# Patient Record
Sex: Male | Born: 1965 | Race: Black or African American | Hispanic: No | Marital: Married | Smoking: Never smoker
Health system: Southern US, Community
[De-identification: ages and names within clinical notes are randomized; demographics above are authoritative.]

---

## 2013-01-25 ENCOUNTER — Ambulatory Visit: Payer: Self-pay | Admitting: Nurse Practitioner

## 2021-05-02 ENCOUNTER — Encounter: Payer: Self-pay | Admitting: Pulmonary Disease

## 2021-06-06 ENCOUNTER — Institutional Professional Consult (permissible substitution): Payer: No Typology Code available for payment source | Admitting: Emergency Medicine

## 2021-06-06 ENCOUNTER — Other Ambulatory Visit: Payer: Self-pay

## 2021-06-06 ENCOUNTER — Encounter: Payer: Self-pay | Admitting: Emergency Medicine

## 2021-06-06 ENCOUNTER — Ambulatory Visit (INDEPENDENT_AMBULATORY_CARE_PROVIDER_SITE_OTHER): Payer: No Typology Code available for payment source | Admitting: Emergency Medicine

## 2021-06-06 VITALS — BP 124/68 | HR 103 | Temp 98.3°F | Ht 78.0 in | Wt 273.8 lb

## 2021-06-06 DIAGNOSIS — J84116 Cryptogenic organizing pneumonia: Secondary | ICD-10-CM | POA: Diagnosis not present

## 2021-06-06 DIAGNOSIS — Z79899 Other long term (current) drug therapy: Secondary | ICD-10-CM

## 2021-06-06 LAB — CBC WITH DIFFERENTIAL/PLATELET
Basophils Absolute: 0 10*3/uL (ref 0.0–0.1)
Basophils Relative: 0.3 % (ref 0.0–3.0)
Eosinophils Absolute: 0 10*3/uL (ref 0.0–0.7)
Eosinophils Relative: 0.3 % (ref 0.0–5.0)
HCT: 46.1 % (ref 39.0–52.0)
Hemoglobin: 15.8 g/dL (ref 13.0–17.0)
Lymphocytes Relative: 11.5 % — ABNORMAL LOW (ref 12.0–46.0)
Lymphs Abs: 0.8 10*3/uL (ref 0.7–4.0)
MCHC: 34.4 g/dL (ref 30.0–36.0)
MCV: 96.6 fl (ref 78.0–100.0)
Monocytes Absolute: 0.4 10*3/uL (ref 0.1–1.0)
Monocytes Relative: 5.3 % (ref 3.0–12.0)
Neutro Abs: 5.6 10*3/uL (ref 1.4–7.7)
Neutrophils Relative %: 82.6 % — ABNORMAL HIGH (ref 43.0–77.0)
Platelets: 262 10*3/uL (ref 150.0–400.0)
RBC: 4.77 Mil/uL (ref 4.22–5.81)
RDW: 12.2 % (ref 11.5–15.5)
WBC: 6.8 10*3/uL (ref 4.0–10.5)

## 2021-06-06 LAB — COMPREHENSIVE METABOLIC PANEL
ALT: 29 U/L (ref 0–53)
AST: 29 U/L (ref 0–37)
Albumin: 4.6 g/dL (ref 3.5–5.2)
Alkaline Phosphatase: 63 U/L (ref 39–117)
BUN: 18 mg/dL (ref 6–23)
CO2: 25 mEq/L (ref 19–32)
Calcium: 9.8 mg/dL (ref 8.4–10.5)
Chloride: 101 mEq/L (ref 96–112)
Creatinine, Ser: 0.92 mg/dL (ref 0.40–1.50)
GFR: 94.84 mL/min (ref >60.00)
Glucose, Bld: 190 mg/dL — ABNORMAL HIGH (ref 70–99)
Potassium: 4.1 mEq/L (ref 3.5–5.1)
Sodium: 137 mEq/L (ref 135–145)
Total Bilirubin: 0.7 mg/dL (ref 0.2–1.2)
Total Protein: 7.6 g/dL (ref 6.0–8.3)

## 2021-06-06 MED ORDER — AZATHIOPRINE 50 MG PO TABS: 150.0000 mg | ORAL_TABLET | Freq: Every morning | ORAL | 1 refills | Status: DC

## 2021-06-06 NOTE — Addendum Note (Signed)
Addended by: Demetrio Lapping E on: 06/06/2021 02:58 PM   Modules accepted: Orders

## 2021-06-06 NOTE — Addendum Note (Signed)
Addended by: Dorisann Frames R on: 06/06/2021 04:53 PM   Modules accepted: Orders

## 2021-06-06 NOTE — Patient Instructions (Signed)
We will check lab work today. We will plan to increase your Imuran to 150 mg daily.  We will call you with your lab results and confirm that you should start the new dose. Continue your prednisone 20 mg once daily. Continue your Bactrim 3 times a week. At your next visit we will discuss the timing of a repeat CT scan of the chest Follow with Dr Delton Coombes in 1 month

## 2021-06-06 NOTE — Assessment & Plan Note (Signed)
With steroid responsive infiltrates followed at the [place].  He had recurrent cough and recurrent pulmonary infiltrates on a CT scan from 12/2020 while his prednisone was being tapered.  He went back to 40 mg of prednisone daily and then started Imuran.  He is currently on Imuran 100 mg, prednisone down to 20 mg.  I will check CBC, CMP today and if acceptable increase his Imuran to 150 mg daily.  We can probably use this as a maintenance dose and then check repeat CT chest.  If he has had improvement in his pulmonary infiltrates then will plan to withdraw his prednisone slowly and leave him on the Imuran 150.  He will need surveillance labs while on the Imuran.

## 2021-06-06 NOTE — Progress Notes (Signed)
Subjective:    Patient ID: Sean Simmons, male    DOB: [birth year], 55 y.o.   MRN: [id number]  HPI 55 year old man never smoker has been followed at [facility] by [facility] for abnormal CT scan of the chest. Has a history of diabetes.  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  Has been treated with corticosteroids with some resolution, and was being tapered. He had made it as low as 5mg , but then started to have cough and increase in infiltrates on Ct chest  >  He had a repeat CT scan of the chest in mid March at the [place] that showed recurrence of his base predominant groundglass infiltrative changes. He was started on prednisone and Imuran. Currently on 20mg  and 100mg .  He is still able to exercise and exert. Occasional cough. No hemoptysis.    Review of Systems As per HPI  PMH: Diabetes mellitus Cryptogenic organizing pneumonia  Social History   Socioeconomic History   Marital status: Married    Spouse name: Not on file   Number of children: Not on file   Years of education: Not on file   Highest education level: Not on file  Occupational History   Not on file  Tobacco Use   Smoking status: Never   Smokeless tobacco: Never  Substance and Sexual Activity   Alcohol use: Not on file   Drug use: Not on file   Sexual activity: Not on file  Other Topics Concern   Not on file  Social History Narrative   Not on file   Social Determinants of Health   Financial Resource Strain: Not on file  Food Insecurity: Not on file  Transportation Needs: Not on file  Physical Activity: Not on file  Stress: Not on file  Social Connections: Not on file  Intimate Partner Violence: Not on file    Was in the [place] Has lived in , , [place], [place] No inhaled exposures.   Allergies  Allergen Reactions   Aspirin-Acetaminophen-Caffeine Other (See Comments)   Iodinated Diagnostic Agents Other (See  Comments) and Swelling   Sildenafil Other (See Comments)     Outpatient Medications Prior to Visit  Medication Sig Dispense Refill   azaTHIOprine (IMURAN) 50 MG tablet Take 2 tablets by mouth every morning.     empagliflozin (JARDIANCE) 25 MG TABS tablet Take 0.5 tablets by mouth daily.     glipiZIDE (GLUCOTROL) 10 MG tablet TAKE ONE TABLET BY MOUTH TWICE A DAY BEFORE MEALS FOR DIABETES (TAKE THIS MEDICATION 30 MINUTES PRIOR TO A MEAL)     predniSONE (DELTASONE) 5 MG tablet 20 mg.     sulfamethoxazole-trimethoprim (BACTRIM DS) 800-160 MG tablet TAKE 1 TABLET BY MOUTH MONDAY, WEDNESDAY AND FRIDAY     metFORMIN (GLUCOPHAGE) 500 MG tablet Take by mouth.     No facility-administered medications prior to visit.        Objective:   Physical Exam  Vitals:   06/06/21 1422  BP: 124/68  Pulse: (!) 103  Temp: 98.3 F (36.8 C)  TempSrc: Oral  SpO2: 96%  Weight: 273 lb 12.8 oz (124.2 kg)  Height: 6\' 6"  (1.981 m)    \Gen: Pleasant, well-nourished, in no distress,  normal affect  ENT: No lesions,  mouth clear,  oropharynx clear, no postnasal drip  Neck: No JVD, no stridor  Lungs: No use of accessory muscles, few bibasilar inspiratory crackles, right greater than left  Cardiovascular: RRR, 2/6 crescendo murmur with an intact S2  Musculoskeletal: No deformities, no cyanosis or clubbing  Neuro: alert, awake, non focal  Skin: Warm, no lesions or rash     Assessment & Plan:   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  He had recurrent cough and recurrent pulmonary infiltrates on a CT scan from 12/2020 while his prednisone was being tapered.  He went back to 40 mg of prednisone daily and then started Imuran.  He is currently on Imuran 100 mg, prednisone down to 20 mg.  I will check CBC, CMP today and if acceptable increase his Imuran to 150 mg daily.  We can probably use this as a maintenance dose and then check repeat CT chest.  If  he has had improvement in his pulmonary infiltrates then will plan to withdraw his prednisone slowly and leave him on the Imuran 150.  He will need surveillance labs while on the Imuran.  Time spent reviewing records, imaging and with patient 60 minutes  Levy Pupa, MD, PhD 06/06/2021, 2:54 PM [facility] [phone] or if no answer before 7:00PM call [phone] For any issues after 7:00PM please call eLink [phone]

## 2021-06-13 ENCOUNTER — Institutional Professional Consult (permissible substitution): Payer: No Typology Code available for payment source | Admitting: Pulmonary Disease

## 2021-07-10 ENCOUNTER — Telehealth: Payer: Self-pay | Admitting: Emergency Medicine

## 2021-07-10 NOTE — Telephone Encounter (Signed)
Called and spoke with patient to let him know that I didn't see in his chart where anyone has tried to reach out to him since his OV in August. Told him my best guess was that it was his reminder call for his appt this Thursday. Patient expressed understanding and confirmed appt date and time. Nothing further needed at this time .

## 2021-07-12 ENCOUNTER — Other Ambulatory Visit: Payer: Self-pay

## 2021-07-12 ENCOUNTER — Encounter: Payer: Self-pay | Admitting: Emergency Medicine

## 2021-07-12 ENCOUNTER — Ambulatory Visit (INDEPENDENT_AMBULATORY_CARE_PROVIDER_SITE_OTHER): Payer: No Typology Code available for payment source | Admitting: Emergency Medicine

## 2021-07-12 DIAGNOSIS — J84116 Cryptogenic organizing pneumonia: Secondary | ICD-10-CM | POA: Diagnosis not present

## 2021-07-12 LAB — CBC WITH DIFFERENTIAL/PLATELET
Basophils Absolute: 0 10*3/uL (ref 0.0–0.1)
Basophils Relative: 0.6 % (ref 0.0–3.0)
Eosinophils Absolute: 0.1 10*3/uL (ref 0.0–0.7)
Eosinophils Relative: 1.7 % (ref 0.0–5.0)
HCT: 44.6 % (ref 39.0–52.0)
Hemoglobin: 14.9 g/dL (ref 13.0–17.0)
Lymphocytes Relative: 39.6 % (ref 12.0–46.0)
Lymphs Abs: 2.2 10*3/uL (ref 0.7–4.0)
MCHC: 33.4 g/dL (ref 30.0–36.0)
MCV: 98 fl (ref 78.0–100.0)
Monocytes Absolute: 0.6 10*3/uL (ref 0.1–1.0)
Monocytes Relative: 11.3 % (ref 3.0–12.0)
Neutro Abs: 2.6 10*3/uL (ref 1.4–7.7)
Neutrophils Relative %: 46.8 % (ref 43.0–77.0)
Platelets: 230 10*3/uL (ref 150.0–400.0)
RBC: 4.55 Mil/uL (ref 4.22–5.81)
RDW: 12.6 % (ref 11.5–15.5)
WBC: 5.6 10*3/uL (ref 4.0–10.5)

## 2021-07-12 LAB — COMPREHENSIVE METABOLIC PANEL
ALT: 18 U/L (ref 0–53)
AST: 21 U/L (ref 0–37)
Albumin: 4.3 g/dL (ref 3.5–5.2)
Alkaline Phosphatase: 57 U/L (ref 39–117)
BUN: 15 mg/dL (ref 6–23)
CO2: 28 mEq/L (ref 19–32)
Calcium: 9 mg/dL (ref 8.4–10.5)
Chloride: 103 mEq/L (ref 96–112)
Creatinine, Ser: 0.92 mg/dL (ref 0.40–1.50)
GFR: 94.77 mL/min (ref >60.00)
Glucose, Bld: 103 mg/dL — ABNORMAL HIGH (ref 70–99)
Potassium: 3.7 mEq/L (ref 3.5–5.1)
Sodium: 139 mEq/L (ref 135–145)
Total Bilirubin: 0.6 mg/dL (ref 0.2–1.2)
Total Protein: 7.1 g/dL (ref 6.0–8.3)

## 2021-07-12 NOTE — Assessment & Plan Note (Signed)
Tolerating Imuran 150.  He has had some diarrhea but this was better when he started taking the medication with food.  He remains on this as well as prednisone 20.  We need to repeat his CT now, see if his infiltrates have improved compared with March.  If so then we will try to start withdrawing the prednisone, keep him on Imuran 150 as maintenance.  He will get his CT chest done at the [place] and then follow-up with me to review.  We will check a CBC and a CMP today to monitor him while he is on the Imuran.

## 2021-07-12 NOTE — Progress Notes (Signed)
Subjective:    Patient ID: Sean Simmons, male    DOB: 07/27/1966, 55 y.o.   MRN: [id number]  HPI 55 year old man never smoker has been followed at [facility] by [facility] for abnormal CT scan of the chest. Has a history of diabetes.  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  Has been treated with corticosteroids with some resolution, and was being tapered. He had made it as low as 5mg , but then started to have cough and increase in infiltrates on Ct chest  >  He had a repeat CT scan of the chest in mid March at the [place] that showed recurrence of his base predominant groundglass infiltrative changes. He was started on prednisone and Imuran. Currently on 20mg  and 100mg .  He is still able to exercise and exert. Occasional cough. No hemoptysis.   ROV 07/12/21 --follow-up visit 55 year old gentleman with an abnormal CT chest and presumed cryptogenic organizing pneumonia (COP).  He has not been able to completely come off steroids due to recurrence of groundglass infiltrates so was started at the on Imuran.  When I saw him last month we checked labs and then were able to increase his Imuran to 150 mg daily. He will need lab work today and then a surveillance CT to look for improvement in his infiltrates with the prednisone and Imuran. He has had some diarrhea, but better when he takes imuran w food. No new dyspnea or breathing issues.    Review of Systems As per HPI  PMH: Diabetes mellitus Cryptogenic organizing pneumonia  Social History   Socioeconomic History   Marital status: Married    Spouse name: Not on file   Number of children: Not on file   Years of education: Not on file   Highest education level: Not on file  Occupational History   Not on file  Tobacco Use   Smoking status: Never   Smokeless tobacco: Never  Substance and Sexual Activity   Alcohol use: Not on file   Drug use: Not on  file   Sexual activity: Not on file  Other Topics Concern   Not on file  Social History Narrative   Not on file   Social Determinants of Health   Financial Resource Strain: Not on file  Food Insecurity: Not on file  Transportation Needs: Not on file  Physical Activity: Not on file  Stress: Not on file  Social Connections: Not on file  Intimate Partner Violence: Not on file    Was in the 07/14/21 Has lived in [place], [place] No inhaled exposures.   Allergies  Allergen Reactions   Aspirin-Acetaminophen-Caffeine Other (See Comments)   Iodinated Diagnostic Agents Other (See Comments) and Swelling   Sildenafil Other (See Comments)     Outpatient Medications Prior to Visit  Medication Sig Dispense Refill   azaTHIOprine (IMURAN) 50 MG tablet Take 3 tablets (150 mg total) by mouth every morning. 90 tablet 1   empagliflozin (JARDIANCE) 25 MG TABS tablet Take 0.5 tablets by mouth daily.     glipiZIDE (GLUCOTROL) 10 MG tablet TAKE ONE TABLET BY MOUTH TWICE A DAY BEFORE MEALS FOR DIABETES (TAKE THIS MEDICATION 30 MINUTES PRIOR TO A MEAL)     metFORMIN (GLUCOPHAGE) 500 MG tablet Take by mouth.     predniSONE (DELTASONE) 5 MG tablet 20 mg.     sulfamethoxazole-trimethoprim (BACTRIM DS) 800-160 MG tablet TAKE 1 TABLET BY  MOUTH MONDAY, WEDNESDAY AND FRIDAY     No facility-administered medications prior to visit.        Objective:   Physical Exam  Vitals:   07/12/21 0911  BP: 112/80  Pulse: 96  Temp: 98.3 F (36.8 C)  TempSrc: Oral  SpO2: 97%  Weight: 278 lb 6.4 oz (126.3 kg)  Height: 6\' 6"  (1.981 m)    \Gen: Pleasant, well-nourished, in no distress,  normal affect  ENT: No lesions,  mouth clear,  oropharynx clear, no postnasal drip  Neck: No JVD, no stridor  Lungs: No use of accessory muscles, few bibasilar inspiratory crackles, right greater than left  Cardiovascular: RRR, 2/6 crescendo murmur with an intact S2  Musculoskeletal: No deformities, no cyanosis or  clubbing  Neuro: alert, awake, non focal  Skin: Warm, no lesions or rash     Assessment & Plan:   Cryptogenic organizing pneumonia (HCC) Tolerating Imuran 150.  He has had some diarrhea but this was better when he started taking the medication with food.  He remains on this as well as prednisone 20.  We need to repeat his CT now, see if his infiltrates have improved compared with March.  If so then we will try to start withdrawing the prednisone, keep him on Imuran 150 as maintenance.  He will get his CT chest done at the [place] and then follow-up with me to review.  We will check a CBC and a CMP today to monitor him while he is on the Imuran.    [place], MD, PhD 07/12/2021, 9:37 AM [facility] [phone] or if no answer before 7:00PM call [phone] For any issues after 7:00PM please call eLink [phone]

## 2021-07-12 NOTE — Patient Instructions (Signed)
Please continue the Imuran 150 mg once daily, and the prednisone 20 mg daily. We will check your screening blood work today We will repeat your CT scan of the chest at the [place] to look for improvement in your lung inflammation. If your CT scan is improved then we will start slowly decreasing your prednisone.  Plan will be to try to get to zero Follow Dr. Delton Coombes in office after your CT.

## 2021-07-18 ENCOUNTER — Other Ambulatory Visit: Payer: Self-pay

## 2021-07-18 ENCOUNTER — Ambulatory Visit (INDEPENDENT_AMBULATORY_CARE_PROVIDER_SITE_OTHER)
Admission: RE | Admit: 2021-07-18 | Discharge: 2021-07-18 | Disposition: A | Discharge status: Home / Self Care | Payer: No Typology Code available for payment source | Source: Ambulatory Visit | Attending: Emergency Medicine | Admitting: Emergency Medicine

## 2021-07-18 DIAGNOSIS — J84116 Cryptogenic organizing pneumonia: Secondary | ICD-10-CM

## 2021-07-30 ENCOUNTER — Other Ambulatory Visit: Payer: Self-pay

## 2021-07-30 ENCOUNTER — Ambulatory Visit
Admission: RE | Admit: 2021-07-30 | Discharge: 2021-07-30 | Disposition: A | Discharge status: Home / Self Care | Payer: Self-pay | Source: Ambulatory Visit | Attending: Emergency Medicine | Admitting: Emergency Medicine

## 2021-07-30 DIAGNOSIS — J84116 Cryptogenic organizing pneumonia: Secondary | ICD-10-CM

## 2021-08-25 NOTE — Telephone Encounter (Signed)
RB please advise. Thanks.  

## 2021-08-27 MED ORDER — AZATHIOPRINE 50 MG PO TABS: 150.0000 mg | ORAL_TABLET | Freq: Every morning | ORAL | 1 refills | Status: DC

## 2021-08-27 NOTE — Telephone Encounter (Signed)
Yes, we should refill and stay on 150mg  dose. I reviewed his CT chest, looks better than in March. I'd like to start weaning his prednisone. Have him decrease to prednisone 15mg  qd, and then follow with me to assess how he is doing on this regimen.

## 2021-10-16 ENCOUNTER — Encounter: Payer: Self-pay | Admitting: Emergency Medicine

## 2021-10-17 MED ORDER — AZATHIOPRINE 50 MG PO TABS: 150.0000 mg | ORAL_TABLET | Freq: Every morning | ORAL | 5 refills | Status: DC

## 2021-10-17 MED ORDER — SULFAMETHOXAZOLE-TRIMETHOPRIM 800-160 MG PO TABS: ORAL_TABLET | ORAL | 1 refills | Status: DC

## 2021-10-18 ENCOUNTER — Telehealth: Payer: Self-pay | Admitting: Emergency Medicine

## 2021-10-18 DIAGNOSIS — J84116 Cryptogenic organizing pneumonia: Secondary | ICD-10-CM

## 2021-10-18 NOTE — Telephone Encounter (Signed)
I do not see any standing CT orders. Patient's last CT scan was back in Oct 2022.   RB, does he need to have another CT scan?

## 2021-10-19 NOTE — Telephone Encounter (Signed)
Patient had CT scan done on 08/08/21. You have nothing available until 11/27/2021 unless you give [place] permission to use one of your held slots.

## 2021-10-19 NOTE — Telephone Encounter (Signed)
[facility] If he has not already had it, then I want him to have a Ct chest without contrast for cryptogenic organizing pneumonia. Then he needs follow up with me to review

## 2021-10-22 NOTE — Telephone Encounter (Signed)
I can give him a 1:30pm spot

## 2021-10-22 NOTE — Telephone Encounter (Signed)
Scheduled pt for OV with Dr. Delton Coombes on 11/06/21 at 1:30 pm. Order placed for CT. Nothing further needed at this time.

## 2021-11-02 ENCOUNTER — Other Ambulatory Visit: Payer: Self-pay

## 2021-11-02 ENCOUNTER — Ambulatory Visit (INDEPENDENT_AMBULATORY_CARE_PROVIDER_SITE_OTHER)
Admission: RE | Admit: 2021-11-02 | Discharge: 2021-11-02 | Disposition: A | Discharge status: Home / Self Care | Payer: No Typology Code available for payment source | Source: Ambulatory Visit | Attending: Emergency Medicine | Admitting: Emergency Medicine

## 2021-11-02 DIAGNOSIS — J84116 Cryptogenic organizing pneumonia: Secondary | ICD-10-CM | POA: Diagnosis not present

## 2021-11-06 ENCOUNTER — Encounter: Payer: Self-pay | Admitting: Emergency Medicine

## 2021-11-06 ENCOUNTER — Ambulatory Visit (INDEPENDENT_AMBULATORY_CARE_PROVIDER_SITE_OTHER): Payer: No Typology Code available for payment source | Admitting: Emergency Medicine

## 2021-11-06 ENCOUNTER — Other Ambulatory Visit: Payer: Self-pay

## 2021-11-06 DIAGNOSIS — J84116 Cryptogenic organizing pneumonia: Secondary | ICD-10-CM

## 2021-11-06 LAB — COMPREHENSIVE METABOLIC PANEL
ALT: 16 U/L (ref 0–53)
AST: 21 U/L (ref 0–37)
Albumin: 4.6 g/dL (ref 3.5–5.2)
Alkaline Phosphatase: 54 U/L (ref 39–117)
BUN: 17 mg/dL (ref 6–23)
CO2: 25 mEq/L (ref 19–32)
Calcium: 9.6 mg/dL (ref 8.4–10.5)
Chloride: 101 mEq/L (ref 96–112)
Creatinine, Ser: 0.93 mg/dL (ref 0.40–1.50)
GFR: 93.34 mL/min (ref >60.00)
Glucose, Bld: 142 mg/dL — ABNORMAL HIGH (ref 70–99)
Potassium: 4.2 mEq/L (ref 3.5–5.1)
Sodium: 138 mEq/L (ref 135–145)
Total Bilirubin: 0.5 mg/dL (ref 0.2–1.2)
Total Protein: 7.5 g/dL (ref 6.0–8.3)

## 2021-11-06 LAB — CBC WITH DIFFERENTIAL/PLATELET
Basophils Absolute: 0 10*3/uL (ref 0.0–0.1)
Basophils Relative: 0.4 % (ref 0.0–3.0)
Eosinophils Absolute: 0 10*3/uL (ref 0.0–0.7)
Eosinophils Relative: 0.6 % (ref 0.0–5.0)
HCT: 45.7 % (ref 39.0–52.0)
Hemoglobin: 15.4 g/dL (ref 13.0–17.0)
Lymphocytes Relative: 16.9 % (ref 12.0–46.0)
Lymphs Abs: 0.9 10*3/uL (ref 0.7–4.0)
MCHC: 33.8 g/dL (ref 30.0–36.0)
MCV: 100.2 fl — ABNORMAL HIGH (ref 78.0–100.0)
Monocytes Absolute: 0.3 10*3/uL (ref 0.1–1.0)
Monocytes Relative: 5.2 % (ref 3.0–12.0)
Neutro Abs: 4.2 10*3/uL (ref 1.4–7.7)
Neutrophils Relative %: 76.9 % (ref 43.0–77.0)
Platelets: 258 10*3/uL (ref 150.0–400.0)
RBC: 4.56 Mil/uL (ref 4.22–5.81)
RDW: 12.6 % (ref 11.5–15.5)
WBC: 5.5 10*3/uL (ref 4.0–10.5)

## 2021-11-06 NOTE — Addendum Note (Signed)
Addended by: Dorisann Frames R on: 11/06/2021 02:19 PM   Modules accepted: Orders

## 2021-11-06 NOTE — Progress Notes (Signed)
Subjective:    Patient ID: Sean Simmons, male    DOB: [birth year], 56 y.o.   MRN: [id number]  HPI 56 year old man never smoker has been followed at [facility] by [facility] for abnormal CT scan of the chest. Has a history of diabetes.  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  Has been treated with corticosteroids with some resolution, and was being tapered. He had made it as low as $Remo'5mg'AmAev$ , but then started to have cough and increase in infiltrates on Ct chest  >  He had a repeat CT scan of the chest in mid March at the [place] that showed recurrence of his base predominant groundglass infiltrative changes. He was started on prednisone and Imuran. Currently on $RemoveBefo'20mg'oetgxjohUTt$  and $Re'100mg'VvP$ .  He is still able to exercise and exert. Occasional cough. No hemoptysis.   ROV 07/12/21 --follow-up visit 56 year old gentleman with an abnormal CT chest and presumed cryptogenic organizing pneumonia (COP).  He has not been able to completely come off steroids due to recurrence of groundglass infiltrates so was started at the [place] on Imuran.  When I saw him last month we checked labs and then were able to increase his Imuran to 150 mg daily. He will need lab work today and then a surveillance CT to look for improvement in his infiltrates with the prednisone and Imuran. He has had some diarrhea, but better when he takes imuran w food. No new dyspnea or breathing issues.   ROV 11/06/21 --56 year old man with presumed cryptogenic organizing pneumonia that has been steroid-dependent.  Started on Imuran which I increased to 150 mg daily.  CBC and c-Met both normal 07/12/2021.  He has remained on prednisone 15 mg daily.  He reports today that he is stable, feels the same, good functional capacity.   CT chest 11/02/2021 reviewed by me shows bilateral lower lobe patchy groundglass infiltrates and mild peribronchial thickening with bronchiectatic change  consistent with COP.  No new nodules or infiltrates present   Review of Systems As per HPI  PMH: Diabetes mellitus Cryptogenic organizing pneumonia  Social History   Socioeconomic History   Marital status: Married    Spouse name: Not on file   Number of children: Not on file   Years of education: Not on file   Highest education level: Not on file  Occupational History   Not on file  Tobacco Use   Smoking status: Never   Smokeless tobacco: Never  Substance and Sexual Activity   Alcohol use: Not on file   Drug use: Not on file   Sexual activity: Not on file  Other Topics Concern   Not on file  Social History Narrative   Not on file   Social Determinants of Health   Financial Resource Strain: Not on file  Food Insecurity: Not on file  Transportation Needs: Not on file  Physical Activity: Not on file  Stress: Not on file  Social Connections: Not on file  Intimate Partner Violence: Not on file    Was in the [place] Has lived in [place], [place], [place], [place] No inhaled exposures.   Allergies  Allergen Reactions   Aspirin-Acetaminophen-Caffeine Other (See Comments)   Iodinated Contrast Media Other (See Comments) and Swelling   Sildenafil Other (See Comments)     Outpatient Medications Prior to Visit  Medication Sig Dispense Refill   azaTHIOprine (IMURAN) 50 MG tablet Take 3 tablets (150 mg total) by  mouth every morning. 90 tablet 5   empagliflozin (JARDIANCE) 25 MG TABS tablet Take 0.5 tablets by mouth daily.     glipiZIDE (GLUCOTROL) 10 MG tablet TAKE ONE TABLET BY MOUTH TWICE A DAY BEFORE MEALS FOR DIABETES (TAKE THIS MEDICATION 30 MINUTES PRIOR TO A MEAL)     metFORMIN (GLUCOPHAGE) 500 MG tablet Take by mouth.     predniSONE (DELTASONE) 5 MG tablet 20 mg.     sulfamethoxazole-trimethoprim (BACTRIM DS) 800-160 MG tablet TAKE 1 TABLET BY MOUTH MONDAY, WEDNESDAY AND FRIDAY 90 tablet 1   No facility-administered medications prior to visit.        Objective:   Physical  Exam  Vitals:   11/06/21 1401  BP: 136/80  Pulse: 95  Temp: 98.1 F (36.7 C)  TempSrc: Oral  SpO2: 95%  Weight: 278 lb (126.1 kg)  Height: $Remove'6\' 6"'AzTKRVz$  (1.981 m)    \Gen: Pleasant, well-nourished, in no distress,  normal affect  ENT: No lesions,  mouth clear,  oropharynx clear, no postnasal drip  Neck: No JVD, no stridor  Lungs: No use of accessory muscles, few bibasilar inspiratory crackles, right greater than left  Cardiovascular: RRR, 2/6 crescendo murmur with an intact S2  Musculoskeletal: No deformities, no cyanosis or clubbing  Neuro: alert, awake, non focal  Skin: Warm, no lesions or rash     Assessment & Plan:   Cryptogenic organizing pneumonia ([place]) His CT scan has not really changed in the interval.  I had hoped that his basilar infiltrate would clear.  Is possible that this is remaining scar from when he had active cryptogenic organizing pneumonia.  Certainly the infiltrates have not progressed.  I think we can try to start tapering the prednisone.  Hopefully he will tolerate.  In the past he has started to have dyspnea at around 5 mg daily.  I hope that the Imuran will prevent this from happening.  We will check lab work today (CBC, CMP) Decrease your prednisone to 10 mg daily for 3 weeks, then go to 5 mg daily for 3 weeks.  Call our office if your breathing worsens in any way while you are changing the medication. Continue the Imuran 150 mg daily Continue Bactrim on Monday Wednesday Friday Follow Dr. Lamonte Sakai in about 2 months.  At that time we will discuss how your symptoms are doing with the medication changes.  We will also discuss the timing of a repeat CT scan of the chest.    Baltazar Apo, MD, PhD 11/06/2021, 2:15 PM [facility] [phone] or if no answer before 7:00PM call [phone] For any issues after 7:00PM please call eLink [phone]

## 2021-11-06 NOTE — Patient Instructions (Addendum)
We will check lab work today (CBC, CMP) Decrease your prednisone to 10 mg daily for 3 weeks, then go to 5 mg daily for 3 weeks.  Call our office if your breathing worsens in any way while you are changing the medication. Continue the Imuran 150 mg daily Continue Bactrim on Monday Wednesday Friday Follow Dr. Delton Coombes in about 2 months.  At that time we will discuss how your symptoms are doing with the medication changes.  We will also discuss the timing of a repeat CT scan of the chest.

## 2021-11-06 NOTE — Assessment & Plan Note (Signed)
His CT scan has not really changed in the interval.  I had hoped that his basilar infiltrate would clear.  Is possible that this is remaining scar from when he had active cryptogenic organizing pneumonia.  Certainly the infiltrates have not progressed.  I think we can try to start tapering the prednisone.  Hopefully he will tolerate.  In the past he has started to have dyspnea at around 5 mg daily.  I hope that the Imuran will prevent this from happening.  We will check lab work today (CBC, CMP) Decrease your prednisone to 10 mg daily for 3 weeks, then go to 5 mg daily for 3 weeks.  Call our office if your breathing worsens in any way while you are changing the medication. Continue the Imuran 150 mg daily Continue Bactrim on Monday Wednesday Friday Follow Dr. Lamonte Sakai in about 2 months.  At that time we will discuss how your symptoms are doing with the medication changes.  We will also discuss the timing of a repeat CT scan of the chest.

## 2021-12-31 ENCOUNTER — Telehealth: Payer: Self-pay | Admitting: Emergency Medicine

## 2021-12-31 NOTE — Telephone Encounter (Signed)
Called the number to see if I could get a direct number to provider for RB but no answered. Left message for someone to call back.  ?

## 2022-01-08 ENCOUNTER — Ambulatory Visit (INDEPENDENT_AMBULATORY_CARE_PROVIDER_SITE_OTHER): Payer: No Typology Code available for payment source | Admitting: Emergency Medicine

## 2022-01-08 ENCOUNTER — Other Ambulatory Visit: Payer: Self-pay

## 2022-01-08 ENCOUNTER — Encounter: Payer: Self-pay | Admitting: Emergency Medicine

## 2022-01-08 VITALS — BP 120/60 | HR 99 | Temp 98.2°F | Ht 78.0 in | Wt 277.6 lb

## 2022-01-08 DIAGNOSIS — J84116 Cryptogenic organizing pneumonia: Secondary | ICD-10-CM

## 2022-01-08 DIAGNOSIS — Z5181 Encounter for therapeutic drug level monitoring: Secondary | ICD-10-CM

## 2022-01-08 DIAGNOSIS — M461 Sacroiliitis, not elsewhere classified: Secondary | ICD-10-CM | POA: Diagnosis not present

## 2022-01-08 LAB — CBC WITH DIFFERENTIAL/PLATELET
Basophils Absolute: 0 10*3/uL (ref 0.0–0.1)
Basophils Relative: 0.4 % (ref 0.0–3.0)
Eosinophils Absolute: 0 10*3/uL (ref 0.0–0.7)
Eosinophils Relative: 0.8 % (ref 0.0–5.0)
HCT: 44.7 % (ref 39.0–52.0)
Hemoglobin: 15.4 g/dL (ref 13.0–17.0)
Lymphocytes Relative: 20.3 % (ref 12.0–46.0)
Lymphs Abs: 1.2 10*3/uL (ref 0.7–4.0)
MCHC: 34.4 g/dL (ref 30.0–36.0)
MCV: 98.7 fl (ref 78.0–100.0)
Monocytes Absolute: 0.6 10*3/uL (ref 0.1–1.0)
Monocytes Relative: 9.5 % (ref 3.0–12.0)
Neutro Abs: 4.1 10*3/uL (ref 1.4–7.7)
Neutrophils Relative %: 69 % (ref 43.0–77.0)
Platelets: 245 10*3/uL (ref 150.0–400.0)
RBC: 4.53 Mil/uL (ref 4.22–5.81)
RDW: 12.7 % (ref 11.5–15.5)
WBC: 5.9 10*3/uL (ref 4.0–10.5)

## 2022-01-08 LAB — COMPREHENSIVE METABOLIC PANEL
ALT: 19 U/L (ref 0–53)
AST: 22 U/L (ref 0–37)
Albumin: 4.7 g/dL (ref 3.5–5.2)
Alkaline Phosphatase: 75 U/L (ref 39–117)
BUN: 15 mg/dL (ref 6–23)
CO2: 25 mEq/L (ref 19–32)
Calcium: 9.7 mg/dL (ref 8.4–10.5)
Chloride: 103 mEq/L (ref 96–112)
Creatinine, Ser: 1.05 mg/dL (ref 0.40–1.50)
GFR: 80.59 mL/min (ref >60.00)
Glucose, Bld: 121 mg/dL — ABNORMAL HIGH (ref 70–99)
Potassium: 4.2 mEq/L (ref 3.5–5.1)
Sodium: 138 mEq/L (ref 135–145)
Total Bilirubin: 0.7 mg/dL (ref 0.2–1.2)
Total Protein: 7.7 g/dL (ref 6.0–8.3)

## 2022-01-08 MED ORDER — SULFAMETHOXAZOLE-TRIMETHOPRIM 800-160 MG PO TABS: ORAL_TABLET | ORAL | 1 refills | Status: DC

## 2022-01-08 NOTE — Assessment & Plan Note (Signed)
Followed by rheumatology at the [facility]. Dr Asher Muir. Was asked to start sulfasalazine, but hesitant to do so. Apparently also questioning possible start MTX or Humira. I would be cautious with either of these given their propensity to cause pneumonitis. Will try to discuss with Dr Sherril Cong.  ?

## 2022-01-08 NOTE — Patient Instructions (Signed)
Please continue your Imuran 150 mg once daily. ?We will check blood work today. ?Decrease your prednisone to 2.5 mg daily for the next 2 weeks.  At that time stop the prednisone altogether. ?I would be hesitant to use methotrexate for your rheumatological disease given its potential impact on pneumonitis, lung inflammation.  Humira has the potential to cause the same issue although less frequently. ?Follow Dr. Delton Coombes in 1 month or next available. ?We will determine the timing of a repeat CT scan of the chest depending how you are doing next visit. ?

## 2022-01-08 NOTE — Progress Notes (Signed)
? ?Subjective:  ? ? Patient ID: Sean Simmons, male    DOB: 1966/09/21, 56 y.o.   MRN: [id number] ? ?HPI ?56 year old man never smoker has been followed at [facility] by [facility] for abnormal CT scan of the chest. ?Has a history of diabetes.  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  Has been treated with corticosteroids with some resolution, and was being tapered. He had made it as low as $Remo'5mg'AjySF$ , but then started to have cough and increase in infiltrates on Ct chest  >  He had a repeat CT scan of the chest in mid March at the [place] that showed recurrence of his base predominant groundglass infiltrative changes. He was started on prednisone and Imuran. Currently on $RemoveBefo'20mg'SKEFQgxnKhQ$  and $Re'100mg'Pan$ .  ?He is still able to exercise and exert. Occasional cough. No hemoptysis.  ? 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  He has not been able to completely come off steroids due to recurrence of groundglass infiltrates so was started at the [place] on Imuran.  When I saw him last month we checked labs and then were able to increase his Imuran to 150 mg daily. ?He will need lab work today and then a surveillance CT to look for improvement in his infiltrates with the prednisone and Imuran. He has had some diarrhea, but better when he takes imuran w food. No new dyspnea or breathing issues.  ? 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  Started on Imuran which I increased to 150 mg daily.  CBC and c-Met both normal 07/12/2021.  He has remained on prednisone 15 mg daily.  ?He reports today that he is stable, feels the same, good functional capacity.  ? ?CT chest 11/02/2021 reviewed by me shows bilateral lower lobe patchy groundglass infiltrates and mild peribronchial thickening with bronchiectatic change  consistent with COP.  No new nodules or infiltrates present ? ?ROV 01/08/22 --follow-up visit for Sean Simmons.  He is 56 and has history of recurrent pulmonary infiltrates that have been deemed to be cryptogenic organizing pneumonia.  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  Has been steroid-dependent, previously seen at the [place].  Then 11/02/2021 did not show full resolution, question scarring.  We started tapering his prednisone at that time, continued Imuran 150 mg daily. He is down to Prednisone $RemoveBefor'5mg'ERfzNwQvxTOD$ . He is on bactrim.  ?He has seen Rheumatology for sacroiliitis - they are wondering if he could be on MTX or Humira for his joint disease +/- lung process. They are considering sulfasalazine, but the patient is hesitant to start it . Dr Asher Muir at [facility]  ? ? ?Review of Systems ?As per HPI ? ?PMH: ?Diabetes mellitus ?Cryptogenic organizing pneumonia ? ?Social History  ? ?Socioeconomic History  ? Marital status: Married  ?  Spouse name: Not on file  ? Number of children: Not on file  ? Years of education: Not on file  ? Highest education level: Not on file  ?Occupational History  ? Not on file  ?Tobacco Use  ? Smoking status: Never  ? Smokeless tobacco: Never  ?Vaping Use  ? Vaping Use: Never used  ?Substance and Sexual Activity  ? Alcohol use: Not on file  ? Drug use: Not on file  ? Sexual activity: Not on file  ?Other Topics Concern  ?  Not on file  ?Social History Narrative  ? Not on file  ? ?Social Determinants of Health  ? ?Financial Resource Strain: Not on file  ?Food Insecurity: Not on file  ?Transportation Needs: Not on file  ?Physical Activity: Not on file  ?Stress: Not on file  ?Social Connections: Not on file  ?Intimate Partner Violence: Not on file  ?  ?Was in the [place] ?Has lived in [place], [place], [place], [place] ?No inhaled exposures.  ? ?Allergies  ?Allergen Reactions  ? Aspirin-Acetaminophen-Caffeine Other (See Comments)  ? Iodinated Contrast Media Other (See Comments) and  Swelling  ? Sildenafil Other (See Comments)  ?  ? ?Outpatient Medications Prior to Visit  ?Medication Sig Dispense Refill  ? azaTHIOprine (IMURAN) 50 MG tablet Take 3 tablets (150 mg total) by mouth every morning. 90 tablet 5  ? empagliflozin (JARDIANCE) 25 MG TABS tablet Take 0.5 tablets by mouth daily.    ? glipiZIDE (GLUCOTROL) 10 MG tablet TAKE ONE TABLET BY MOUTH TWICE A DAY BEFORE MEALS FOR DIABETES (TAKE THIS MEDICATION 30 MINUTES PRIOR TO A MEAL)    ? metFORMIN (GLUCOPHAGE) 500 MG tablet Take by mouth.    ? predniSONE (DELTASONE) 5 MG tablet 20 mg.    ? sulfamethoxazole-trimethoprim (BACTRIM DS) 800-160 MG tablet TAKE 1 TABLET BY MOUTH MONDAY, WEDNESDAY AND FRIDAY 90 tablet 1  ? ?No facility-administered medications prior to visit.  ? ? ? ?   ?Objective:  ? Physical Exam ? ?Vitals:  ? 01/08/22 1421  ?BP: 120/60  ?Pulse: 99  ?Temp: 98.2 ?F (36.8 ?C)  ?TempSrc: Oral  ?SpO2: 95%  ?Weight: 277 lb 9.6 oz (125.9 kg)  ?Height: $RemoveB'6\' 6"'SVCaewiE$  (1.981 m)  ? ? ?\Gen: Pleasant, well-nourished, in no distress,  normal affect ? ?ENT: No lesions,  mouth clear,  oropharynx clear, no postnasal drip ? ?Neck: No JVD, no stridor ? ?Lungs: No use of accessory muscles, few bibasilar inspiratory crackles, right greater than left ? ?Cardiovascular: RRR, 2/6 crescendo murmur with an intact S2 ? ?Musculoskeletal: No deformities, no cyanosis or clubbing ? ?Neuro: alert, awake, non focal ? ?Skin: Warm, no lesions or rash ? ?   ?Assessment & Plan:  ? ?Cryptogenic organizing pneumonia ([place]) ?He has been able to taper his prednisone down to 5 mg daily since he started the Imuran 150 mg daily.  Overall clinically stable.  We will continue to try to taper the prednisone, goal to off.  I will follow-up with him next month and we will determine timing of a repeat CT scan of the chest to look for any evolution of his infiltrates.  Check CMP and CBC today on the Imuran.  He is on Bactrim 3 times weekly. ? ?Sacroiliitis ([place]) ?Followed by rheumatology at  the [facility]. Dr Asher Muir. Was asked to start sulfasalazine, but hesitant to do so. Apparently also questioning possible start MTX or Humira. I would be cautious with either of these given their propensity to cause pneumonitis. Will try to discuss with Dr Sherril Cong.  ? ? ? ?Baltazar Apo, MD, PhD ?01/08/2022, 3:02 PM ?[place] ?[phone] or if no answer before 7:00PM call [phone] ?For any issues after 7:00PM please call eLink [phone] ? ?

## 2022-01-08 NOTE — Assessment & Plan Note (Signed)
He has been able to taper his prednisone down to 5 mg daily since he started the Imuran 150 mg daily.  Overall clinically stable.  We will continue to try to taper the prednisone, goal to off.  I will follow-up with him next month and we will determine timing of a repeat CT scan of the chest to look for any evolution of his infiltrates.  Check CMP and CBC today on the Imuran.  He is on Bactrim 3 times weekly. ?

## 2022-01-22 NOTE — Telephone Encounter (Signed)
Since phone call, pt has had a recent OV with RB and mediations were discussed during that OV. Nothing further needed. ?

## 2022-02-04 ENCOUNTER — Telehealth: Payer: Self-pay | Admitting: Emergency Medicine

## 2022-02-04 NOTE — Telephone Encounter (Signed)
Spoke with Dr Sherril Cong  ?She specifically is needing to speak with Dr Lamonte Sakai about possibly starting a new med for pt  ?She states this can wait until 02/11/22 when Dr Lamonte Sakai returns to the office  ?Please call her at this number she provided, thanks!  [phone] ? ? ?

## 2022-02-11 NOTE — Telephone Encounter (Signed)
I reviewed the case with her. Considering starting biologics to treat his sacroiliitis. We will confer to determine whether there is a regimen that would treat both this and COP. Right now he is on Imuran 150mg  qd.  ?

## 2022-02-14 ENCOUNTER — Encounter: Payer: Self-pay | Admitting: Emergency Medicine

## 2022-02-14 ENCOUNTER — Ambulatory Visit (INDEPENDENT_AMBULATORY_CARE_PROVIDER_SITE_OTHER): Payer: No Typology Code available for payment source | Admitting: Emergency Medicine

## 2022-02-14 VITALS — BP 132/76 | HR 89 | Temp 98.1°F | Ht 78.0 in | Wt 276.0 lb

## 2022-02-14 DIAGNOSIS — M461 Sacroiliitis, not elsewhere classified: Secondary | ICD-10-CM

## 2022-02-14 DIAGNOSIS — Z5181 Encounter for therapeutic drug level monitoring: Secondary | ICD-10-CM

## 2022-02-14 DIAGNOSIS — J8489 Other specified interstitial pulmonary diseases: Secondary | ICD-10-CM | POA: Diagnosis not present

## 2022-02-14 DIAGNOSIS — J84116 Cryptogenic organizing pneumonia: Secondary | ICD-10-CM | POA: Diagnosis not present

## 2022-02-14 LAB — CBC WITH DIFFERENTIAL/PLATELET
Basophils Absolute: 0 10*3/uL (ref 0.0–0.1)
Basophils Relative: 0.6 % (ref 0.0–3.0)
Eosinophils Absolute: 0.1 10*3/uL (ref 0.0–0.7)
Eosinophils Relative: 2.3 % (ref 0.0–5.0)
HCT: 43.1 % (ref 39.0–52.0)
Hemoglobin: 14.6 g/dL (ref 13.0–17.0)
Lymphocytes Relative: 30.4 % (ref 12.0–46.0)
Lymphs Abs: 1.2 10*3/uL (ref 0.7–4.0)
MCHC: 33.9 g/dL (ref 30.0–36.0)
MCV: 99 fl (ref 78.0–100.0)
Monocytes Absolute: 0.6 10*3/uL (ref 0.1–1.0)
Monocytes Relative: 15.5 % — ABNORMAL HIGH (ref 3.0–12.0)
Neutro Abs: 2.1 10*3/uL (ref 1.4–7.7)
Neutrophils Relative %: 51.2 % (ref 43.0–77.0)
Platelets: 249 10*3/uL (ref 150.0–400.0)
RBC: 4.36 Mil/uL (ref 4.22–5.81)
RDW: 12.6 % (ref 11.5–15.5)
WBC: 4 10*3/uL (ref 4.0–10.5)

## 2022-02-14 LAB — COMPREHENSIVE METABOLIC PANEL
ALT: 21 U/L (ref 0–53)
AST: 22 U/L (ref 0–37)
Albumin: 4.6 g/dL (ref 3.5–5.2)
Alkaline Phosphatase: 64 U/L (ref 39–117)
BUN: 17 mg/dL (ref 6–23)
CO2: 25 mEq/L (ref 19–32)
Calcium: 9.1 mg/dL (ref 8.4–10.5)
Chloride: 103 mEq/L (ref 96–112)
Creatinine, Ser: 1 mg/dL (ref 0.40–1.50)
GFR: 85.39 mL/min (ref >60.00)
Glucose, Bld: 171 mg/dL — ABNORMAL HIGH (ref 70–99)
Potassium: 4 mEq/L (ref 3.5–5.1)
Sodium: 137 mEq/L (ref 135–145)
Total Bilirubin: 0.7 mg/dL (ref 0.2–1.2)
Total Protein: 7.5 g/dL (ref 6.0–8.3)

## 2022-02-14 NOTE — Progress Notes (Signed)
? ?Subjective:  ? ? Patient ID: Sean Simmons, male    DOB: [birth year], 56 y.o.   MRN: [id number] ? ?HPI ? ?ROV 01/08/22 --follow-up visit for Mr. Bolash.  He is 56 and has history of recurrent pulmonary infiltrates that have been deemed to be cryptogenic organizing pneumonia.  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  Has been steroid-dependent, previously seen at the [place].  Then 11/02/2021 did not show full resolution, question scarring.  We started tapering his prednisone at that time, continued Imuran 150 mg daily. He is down to Prednisone 5mg . He is on bactrim.  ?He has seen Rheumatology for sacroiliitis - they are wondering if he could be on MTX or Humira for his joint disease +/- lung process. They are considering sulfasalazine, but the patient is hesitant to start it . Dr Asher Muir at [facility]  ? ?ROV 02/14/2022 --56 year old gentleman with a history of G6PD deficiency, SLE with associated sacroiliitis, OSA, diabetes.  He been followed first at the [place] and then by me for bilateral infiltrates consistent with COP.  He had been steroid-dependent, has been tapering prednisone with the addition of Imuran.  He is also being considered for possible Biologics, Humira, for his sacroiliitis by Dr. Sherril Cong at the [facility] ?His prednisone has now been weaned off, finished it.  He still on Imuran 150 daily and Bactrim 3 times weekly.  He reports that he has had some increased fatigue. He can some chest heaviness, can hear some crackling. Some daily cough, non prod. Has maintained functional capacity but maybe a bit more SOB off the pred. His sacroiliitis -  has been more active, painful. He is working w PT. Considering addition of Humira if not contraindicated from pulm standpoint.  ? ? ?Review of Systems ?As per HPI ? ?   ?Objective:  ? Physical Exam ? ?Vitals:  ? 02/14/22 0856  ?BP: 132/76  ?Pulse: 89  ?Temp: 98.1 ?F (36.7 ?C)  ?TempSrc: Oral  ?SpO2: 96%  ?Weight: 276 lb (125.2 kg)  ?Height: 6'  6" (1.981 m)  ? ? ?\Gen: Pleasant, well-nourished, in no distress,  normal affect ? ?ENT: No lesions,  mouth clear,  oropharynx clear, no postnasal drip ? ?Neck: No JVD, no stridor ? ?Lungs: No use of accessory muscles, few bibasilar inspiratory crackles, right greater than left ? ?Cardiovascular: RRR, 2/6 crescendo murmur with an intact S2 ? ?Musculoskeletal: No deformities, no cyanosis or clubbing ? ?Neuro: alert, awake, non focal ? ?Skin: Warm, no lesions or rash ? ?   ?Assessment & Plan:  ? ?Cryptogenic organizing pneumonia ([place]) ?He has been off prednisone for 2 to 3 weeks, now managed on daily Imuran 150 mg.  He may have noticed some increased fatigue, chest heaviness.  No profound worsening in his respiratory status or functional capacity.  Like to try to stay off the prednisone, continue Imuran for maintenance.  I will check a CT chest to see if he has any evolving recurrence of his cryptogenic organizing pneumonia. ? ?Sacroiliitis ([place]) ?Discussed with Dr .Sherril Cong with rheumatology at the [facility].  They would like to try starting him on Humira.  I do not know of any absolute contraindication although I did review with her and the patient that Humira can sometimes precipitate pulmonary inflammation.  We will need to do close surveillance if he does start the medication. ? ? ? ?Baltazar Apo, MD, PhD ?02/14/2022, 9:19 AM ?[place] [facility] ?[phone] or if no answer  before 7:00PM call [phone] ?For any issues after 7:00PM please call eLink [phone] ? ?

## 2022-02-14 NOTE — Assessment & Plan Note (Signed)
He has been off prednisone for 2 to 3 weeks, now managed on daily Imuran 150 mg.  He may have noticed some increased fatigue, chest heaviness.  No profound worsening in his respiratory status or functional capacity.  Like to try to stay off the prednisone, continue Imuran for maintenance.  I will check a CT chest to see if he has any evolving recurrence of his cryptogenic organizing pneumonia. ?

## 2022-02-14 NOTE — Patient Instructions (Addendum)
We will stay off prednisone for now ?Continue Imuran 150 mg daily ?Continue your Bactrim 3 times a week ?We will check your blood work today for standard monitoring on the Imuran ?We will repeat your CT scan of the chest without contrast to assess for inflammation off of the prednisone ?You may be a candidate to start Humira for your sacroiliitis with Rheumatology.  This medication has been known to cause pulmonary inflammation and other patients.  If this were to happen we would have to stop the medicine. ?Follow Dr. Delton Coombes next available after your CT so we can review the results together. ?

## 2022-02-14 NOTE — Assessment & Plan Note (Signed)
Discussed with Dr .Mariel Craft with rheumatology at the [facility].  They would like to try starting him on Humira.  I do not know of any absolute contraindication although I did review with her and the patient that Humira can sometimes precipitate pulmonary inflammation.  We will need to do close surveillance if he does start the medication. ?

## 2022-03-25 ENCOUNTER — Ambulatory Visit (INDEPENDENT_AMBULATORY_CARE_PROVIDER_SITE_OTHER)
Admission: RE | Admit: 2022-03-25 | Discharge: 2022-03-25 | Disposition: A | Discharge status: Home / Self Care | Payer: No Typology Code available for payment source | Source: Ambulatory Visit | Attending: Emergency Medicine | Admitting: Emergency Medicine

## 2022-03-25 DIAGNOSIS — J8489 Other specified interstitial pulmonary diseases: Secondary | ICD-10-CM

## 2022-03-28 ENCOUNTER — Ambulatory Visit (INDEPENDENT_AMBULATORY_CARE_PROVIDER_SITE_OTHER): Payer: No Typology Code available for payment source | Admitting: Emergency Medicine

## 2022-03-28 ENCOUNTER — Ambulatory Visit: Payer: No Typology Code available for payment source | Admitting: Emergency Medicine

## 2022-03-28 ENCOUNTER — Encounter: Payer: Self-pay | Admitting: Emergency Medicine

## 2022-03-28 DIAGNOSIS — J84116 Cryptogenic organizing pneumonia: Secondary | ICD-10-CM

## 2022-03-28 DIAGNOSIS — R0609 Other forms of dyspnea: Secondary | ICD-10-CM | POA: Diagnosis not present

## 2022-03-28 NOTE — Assessment & Plan Note (Signed)
His CT chest is stable, no evidence for increasing pulmonary infiltrates, yet he has progressive exertional dyspnea over the last 2 months.  He has also heard some wheezing.  Question whether he has some airflow obstruction that we are not currently treating.  He has been on Combivent and albuterol in the past.  I asked him to try using these to see if he gets any clinical benefit when he is short of breath.  We will perform pulmonary function testing to further evaluate.  We will perform pulmonary function testing at your next office visit Try using either your albuterol or your Combivent 2 puffs up to every 6 hours if needed for shortness of breath.  Keep track of whether this medication helps you.  We may use this information to decide whether to start a long-acting inhaler going forward. Follow with Dr. Delton Coombes next available with full pulmonary function testing on the same day

## 2022-03-28 NOTE — Addendum Note (Signed)
Addended by: Dorisann Frames R on: 03/28/2022 04:29 PM   Modules accepted: Orders

## 2022-03-28 NOTE — Assessment & Plan Note (Signed)
We reviewed your CT scan of the chest today.  This is stable compared with January 2023.  Good news Please continue your Imuran 150 mg as you have been taking it. Continue your Bactrim 3 times a week We will repeat your blood work at your next office visit .

## 2022-03-28 NOTE — Progress Notes (Signed)
Subjective:    Patient ID: Sean Simmons, male    DOB: [birth year], 56 y.o.   MRN: [id number]  HPI  ROV 02/14/2022 --56 year old gentleman with a history of G6PD deficiency, SLE with associated sacroiliitis, OSA, diabetes.  He been followed first at the [place] and then by me for bilateral infiltrates consistent with COP.  He had been steroid-dependent, has been tapering prednisone with the addition of Imuran.  He is also being considered for possible Biologics, Humira, for his sacroiliitis by Dr. Mariel Craft at the [facility] His prednisone has now been weaned off, finished it.  He still on Imuran 150 daily and Bactrim 3 times weekly.  He reports that he has had some increased fatigue. He can some chest heaviness, can hear some crackling. Some daily cough, non prod. Has maintained functional capacity but maybe a bit more SOB off the pred. His sacroiliitis -  has been more active, painful. He is working w PT. Considering addition of Humira if not contraindicated from pulm standpoint.   ROV 03/28/22 --follow-up visit for Sean Simmons who has a history of G6PD deficiency, SLE with sacroiliitis, OSA, diabetes and cryptogenic organizing pneumonia.  He had been steroid-dependent, transitioned to Imuran, currently on 150 mg daily (plus Bactrim).  He was being considered for Humira by rheumatology for sacroiliitis.  We repeated his CT scan of the chest 03/25/2022 as below Today he reports that he continues to have exertional SOB for the last 1-2 months, notices it with walking across. No real cough. Occasional wheeze.   CT chest 03/25/2022 reviewed by me, shows stable base predominant peribronchial and subpleural groundglass with some associated traction bronchiolectasis, consider postinflammatory scar.  No significant change compared with 11/02/2021   Review of Systems As per HPI     Objective:   Physical Exam  Vitals:   03/28/22 1428  BP: 120/72  Pulse: 100  Temp: 98.6 F (37 C)  TempSrc: Oral  SpO2: 94%  Weight: 267 lb  6.4 oz (121.3 kg)  Height: 6\' 6"  (1.981 m)    \Gen: Pleasant, well-nourished, in no distress,  normal affect  ENT: No lesions,  mouth clear,  oropharynx clear, no postnasal drip  Neck: No JVD, no stridor  Lungs: No use of accessory muscles, few bibasilar inspiratory crackles, right greater than left  Cardiovascular: RRR, 2/6 crescendo murmur with an intact S2  Musculoskeletal: No deformities, no cyanosis or clubbing  Neuro: alert, awake, non focal  Skin: Warm, no lesions or rash     Assessment & Plan:   Cryptogenic organizing pneumonia [facility]) We reviewed your CT scan of the chest today.  This is stable compared with January 2023.  Good news Please continue your Imuran 150 mg as you have been taking it. Continue your Bactrim 3 times a week We will repeat your blood work at your next office visit .   Dyspnea on exertion His CT chest is stable, no evidence for increasing pulmonary infiltrates, yet he has progressive exertional dyspnea over the last 2 months.  He has also heard some wheezing.  Question whether he has some airflow obstruction that we are not currently treating.  He has been on Combivent and albuterol in the past.  I asked him to try using these to see if he gets any clinical benefit when he is short of breath.  We will perform pulmonary function testing to further evaluate.  We will perform pulmonary function testing at your next office visit Try using either your albuterol or your  Combivent 2 puffs up to every 6 hours if needed for shortness of breath.  Keep track of whether this medication helps you.  We may use this information to decide whether to start a long-acting inhaler going forward. Follow with Dr. Delton Coombes next available with full pulmonary function testing on the same day    Levy Pupa, MD, PhD 03/28/2022, 2:47 PM [place] [facility] [phone] or if no answer before 7:00PM call [phone] For any issues after 7:00PM please call  eLink [phone]

## 2022-03-28 NOTE — Patient Instructions (Signed)
We reviewed your CT scan of the chest today.  This is stable compared with January 2023.  Good news Please continue your Imuran 150 mg as you have been taking it. Continue your Bactrim 3 times a week We will repeat your blood work at your next office visit We will perform pulmonary function testing at your next office visit Try using either your albuterol or your Combivent 2 puffs up to every 6 hours if needed for shortness of breath.  Keep track of whether this medication helps you.  We may use this information to decide whether to start a long-acting inhaler going forward. Follow with Dr. Delton Coombes next available with full pulmonary function testing on the same day.

## 2022-04-03 ENCOUNTER — Encounter: Payer: Self-pay | Admitting: Emergency Medicine

## 2022-04-03 MED ORDER — AZATHIOPRINE 50 MG PO TABS: 150.0000 mg | ORAL_TABLET | Freq: Every morning | ORAL | 5 refills | Status: DC

## 2022-05-21 ENCOUNTER — Ambulatory Visit (INDEPENDENT_AMBULATORY_CARE_PROVIDER_SITE_OTHER): Payer: No Typology Code available for payment source | Admitting: Emergency Medicine

## 2022-05-21 ENCOUNTER — Encounter: Payer: Self-pay | Admitting: Emergency Medicine

## 2022-05-21 DIAGNOSIS — R0609 Other forms of dyspnea: Secondary | ICD-10-CM

## 2022-05-21 DIAGNOSIS — J84116 Cryptogenic organizing pneumonia: Secondary | ICD-10-CM

## 2022-05-21 LAB — COMPREHENSIVE METABOLIC PANEL
ALT: 23 U/L (ref 0–53)
AST: 21 U/L (ref 0–37)
Albumin: 4.6 g/dL (ref 3.5–5.2)
Alkaline Phosphatase: 72 U/L (ref 39–117)
BUN: 12 mg/dL (ref 6–23)
CO2: 27 mEq/L (ref 19–32)
Calcium: 9.6 mg/dL (ref 8.4–10.5)
Chloride: 102 mEq/L (ref 96–112)
Creatinine, Ser: 0.96 mg/dL (ref 0.40–1.50)
GFR: 89.51 mL/min (ref >60.00)
Glucose, Bld: 173 mg/dL — ABNORMAL HIGH (ref 70–99)
Potassium: 4.1 mEq/L (ref 3.5–5.1)
Sodium: 139 mEq/L (ref 135–145)
Total Bilirubin: 0.7 mg/dL (ref 0.2–1.2)
Total Protein: 7.8 g/dL (ref 6.0–8.3)

## 2022-05-21 LAB — PULMONARY FUNCTION TEST
DL/VA % pred: 101 %
DL/VA: 4.27 ml/min/mmHg/L
DLCO cor % pred: 65 %
DLCO cor: 24.29 ml/min/mmHg
DLCO unc % pred: 65 %
DLCO unc: 24.29 ml/min/mmHg
FEF 25-75 Post: 6.28 L/sec
FEF 25-75 Pre: 4.7 L/sec
FEF2575-%Change-Post: 33 %
FEF2575-%Pred-Post: 150 %
FEF2575-%Pred-Pre: 112 %
FEV1-%Change-Post: 5 %
FEV1-%Pred-Post: 75 %
FEV1-%Pred-Pre: 72 %
FEV1-Post: 3.81 L
FEV1-Pre: 3.62 L
FEV1FVC-%Change-Post: 3 %
FEV1FVC-%Pred-Pre: 110 %
FEV6-%Change-Post: 2 %
FEV6-%Pred-Post: 68 %
FEV6-%Pred-Pre: 67 %
FEV6-Post: 4.34 L
FEV6-Pre: 4.26 L
FEV6FVC-%Change-Post: 0 %
FEV6FVC-%Pred-Post: 103 %
FEV6FVC-%Pred-Pre: 103 %
FVC-%Change-Post: 1 %
FVC-%Pred-Post: 66 %
FVC-%Pred-Pre: 64 %
FVC-Post: 4.34 L
FVC-Pre: 4.26 L
Post FEV1/FVC ratio: 88 %
Post FEV6/FVC ratio: 100 %
Pre FEV1/FVC ratio: 85 %
Pre FEV6/FVC Ratio: 100 %
RV % pred: 68 %
RV: 1.8 L
TLC % pred: 69 %
TLC: 6.1 L

## 2022-05-21 LAB — CBC WITH DIFFERENTIAL/PLATELET
Basophils Absolute: 0 10*3/uL (ref 0.0–0.1)
Basophils Relative: 0.5 % (ref 0.0–3.0)
Eosinophils Absolute: 0.1 10*3/uL (ref 0.0–0.7)
Eosinophils Relative: 2.1 % (ref 0.0–5.0)
HCT: 44.5 % (ref 39.0–52.0)
Hemoglobin: 15.2 g/dL (ref 13.0–17.0)
Lymphocytes Relative: 32.2 % (ref 12.0–46.0)
Lymphs Abs: 1.2 10*3/uL (ref 0.7–4.0)
MCHC: 34.1 g/dL (ref 30.0–36.0)
MCV: 96.8 fl (ref 78.0–100.0)
Monocytes Absolute: 0.6 10*3/uL (ref 0.1–1.0)
Monocytes Relative: 15.4 % — ABNORMAL HIGH (ref 3.0–12.0)
Neutro Abs: 1.8 10*3/uL (ref 1.4–7.7)
Neutrophils Relative %: 49.8 % (ref 43.0–77.0)
Platelets: 220 10*3/uL (ref 150.0–400.0)
RBC: 4.6 Mil/uL (ref 4.22–5.81)
RDW: 13.5 % (ref 11.5–15.5)
WBC: 3.6 10*3/uL — ABNORMAL LOW (ref 4.0–10.5)

## 2022-05-21 MED ORDER — COMBIVENT RESPIMAT 20-100 MCG/ACT IN AERS: 1.0000 | INHALATION_SPRAY | Freq: Four times a day (QID) | RESPIRATORY_TRACT | 3 refills | Status: DC

## 2022-05-21 NOTE — Progress Notes (Signed)
Subjective:    Patient ID: Marlou Porch, male    DOB: 1966-02-17, 56 y.o.   MRN: [id number]  HPI  ROV 03/28/22 --follow-up visit for Mr. Mcreynolds who has a history of G6PD deficiency, SLE with sacroiliitis, OSA, diabetes and cryptogenic organizing pneumonia.  He had been steroid-dependent, transitioned to Imuran, currently on 150 mg daily (plus Bactrim).  He was being considered for Humira by rheumatology for sacroiliitis.  We repeated his CT scan of the chest 03/25/2022 as below Today he reports that he continues to have exertional SOB for the last 1-2 months, notices it with walking across. No real cough. Occasional wheeze.   CT chest 03/25/2022 reviewed by me, shows stable base predominant peribronchial and subpleural groundglass with some associated traction bronchiolectasis, consider postinflammatory scar.  No significant change compared with 11/02/2021   ROV 05/21/2022 --Mr. Renton is 55.  He has a history of G6PD deficiency, lupus with sacroiliitis that has required immunosuppression.  Also cryptogenic organizing pneumonia.  He is now off corticosteroids, is on Imuran/Bactrim.  He was never started on Humira - still deals w pain from the sacroiliitis. Most recent CT chest 03/2022 with stable.  He continued to have dyspnea so we perform pulmonary function testing as below.  He restarted albuterol to see if these would give benefit. May have helped relieve some chest tightness, pretreated exertion sometimes. His functional capacity is "ok". He is not SOB at rest, does get dyspnea with walking, climbing stairs.   Pulmonary function testing performed today and reviewed by me shows evidence for restriction without a bronchodilator response.  Lung volumes are restricted.  Diffusion capacity is decreased and corrects to the normal range when adjusted for alveolar volume.   Review of Systems As per HPI     Objective:   Physical Exam  Vitals:   05/21/22 0954  BP: 128/74  Pulse: (!) 105  Temp: 97.7 F  (36.5 C)  TempSrc: Oral  SpO2: 96%  Weight: 257 lb 6.4 oz (116.8 kg)  Height: 6\' 7"  (2.007 m)    \Gen: Pleasant, well-nourished, in no distress,  normal affect  ENT: No lesions,  mouth clear,  oropharynx clear, no postnasal drip  Neck: No JVD, no stridor  Lungs: No use of accessory muscles, few bibasilar inspiratory crackles, right greater than left  Cardiovascular: RRR, 2/6 crescendo murmur with an intact S2  Musculoskeletal: No deformities, no cyanosis or clubbing  Neuro: alert, awake, non focal  Skin: Warm, no lesions or rash     Assessment & Plan:   Cryptogenic organizing pneumonia ([facility]) Stable clinically and stable on imaging in June 2023.  Will plan to continue his azathioprine and Bactrim.  Check surveillance CBC and CMP today.  He never started the Humira for sacroiliitis, does follow with rheumatology.  I will plan to repeat his CT chest in 1 year, sooner if he has any change in his respiratory status.  Dyspnea on exertion Pulmonary function testing today most consistent with restrictive pattern.  That said he may have gotten some benefit from albuterol and he believes he has benefited even more in the past from Combivent.  I think it is reasonable to have him use Combivent as needed even in the absence of any overt obstruction on his PFT.  He will let me know if he gets significant clinical benefit we could consider long-acting BD.    July 2023, MD, PhD 05/21/2022, 1:25 PM [place] [facility] [phone] or if no answer before 7:00PM  call [phone] For any issues after 7:00PM please call eLink [phone]

## 2022-05-21 NOTE — Patient Instructions (Signed)
We will plan to repeat your CT scan of the chest in June 2024.  If you develop new breathing symptoms we may decide to repeat your scan sooner. Continue your azathioprine 150 mg 3 times a day Continue Bactrim 3 times a week Try using Combivent 2 puffs up to every 6 hours if needed for shortness of breath, chest tightness, wheezing.  You could consider using this before exercise. Follow with Dr Delton Coombes in 6 months or sooner if you have any problems

## 2022-05-21 NOTE — Assessment & Plan Note (Addendum)
Stable clinically and stable on imaging in June 2023.  Will plan to continue his azathioprine and Bactrim.  Check surveillance CBC and CMP today.  He never started the Humira for sacroiliitis, does follow with rheumatology.  I will plan to repeat his CT chest in 1 year, sooner if he has any change in his respiratory status.

## 2022-05-21 NOTE — Assessment & Plan Note (Signed)
Pulmonary function testing today most consistent with restrictive pattern.  That said he may have gotten some benefit from albuterol and he believes he has benefited even more in the past from Combivent.  I think it is reasonable to have him use Combivent as needed even in the absence of any overt obstruction on his PFT.  He will let me know if he gets significant clinical benefit we could consider long-acting BD.

## 2022-05-21 NOTE — Progress Notes (Signed)
PFT done today. 

## 2022-09-23 ENCOUNTER — Encounter: Payer: Self-pay | Admitting: Emergency Medicine

## 2022-09-23 MED ORDER — AZATHIOPRINE 50 MG PO TABS: 150.0000 mg | ORAL_TABLET | Freq: Every morning | ORAL | 5 refills | Status: DC

## 2022-11-22 ENCOUNTER — Encounter: Payer: Self-pay | Admitting: Emergency Medicine

## 2022-11-22 ENCOUNTER — Ambulatory Visit (INDEPENDENT_AMBULATORY_CARE_PROVIDER_SITE_OTHER): Payer: No Typology Code available for payment source | Admitting: Emergency Medicine

## 2022-11-22 VITALS — BP 128/76 | HR 100 | Temp 98.3°F | Ht 78.0 in | Wt 247.4 lb

## 2022-11-22 DIAGNOSIS — R0609 Other forms of dyspnea: Secondary | ICD-10-CM | POA: Diagnosis not present

## 2022-11-22 DIAGNOSIS — J84116 Cryptogenic organizing pneumonia: Secondary | ICD-10-CM

## 2022-11-22 NOTE — Assessment & Plan Note (Signed)
Fairly good exercise tolerance, he is trying to exercise more.  No obstruction on his pulmonary function testing but he gets a clinical response to Combivent, occasionally uses.  No clear indication to start scheduled BD therapy at this time.  Plan to continue the Combivent when he needs

## 2022-11-22 NOTE — Patient Instructions (Addendum)
We reviewed your lab work from the [place] from December. Please continue your Imuran 150 mg every day Please continue to take your Bactrim Monday Wednesday Friday We will repeat your CT scan of the chest in June 2024 as planned. We will recheck your blood work in June 2024 as well Okay to keep Combivent available to use 2 puffs up to every 6 hours if needed for shortness of breath, chest tightness, wheezing Follow Dr. Lamonte Sakai in June after your CT so we can review the results together, sooner if you have any problems.

## 2022-11-22 NOTE — Progress Notes (Signed)
Subjective:    Patient ID: Sean Simmons, male    DOB: 1966/08/03, 57 y.o.   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  Currently managed on Imuran with prophylactic Bactrim.  There had been some discussion about him possibly starting Humira for his sacroiliitis but he has not needed to do so.  He does follow with rheumatology.  He has formally been on corticosteroids but has been weaned off.  He has restrictive lung disease on pulmonary function testing without any evidence of obstruction.  He has benefited from Combivent in the past and has available to use as needed.  Today he reports.  Since have seen him he has started Ozempic.  He is on azathioprine 150 mg daily, Bactrim Monday Wednesday Friday.  He last had blood work at the [place] in 09/2022 > LFT normal. He is trying to be a bit more active - walking and exercising more. Uses combivent about 2x a month, does seem to help him.   Last CT chest was 03/25/2022 with basilar predominant peribronchial and subpleural groundglass, some traction bronchiectasis, postinflammatory scarring.  Stable from priors.  We are planning for repeat in June 2024     Latest Ref Rng & Units 05/21/2022   10:25 AM 02/14/2022    9:24 AM 01/08/2022    2:56 PM  CBC  WBC 4.0 - 10.5 K/uL 3.6  4.0  5.9   Hemoglobin 13.0 - 17.0 g/dL 15.2  14.6  15.4   Hematocrit 39.0 - 52.0 % 44.5  43.1  44.7   Platelets 150.0 - 400.0 K/uL 220.0  249.0  245.0        Latest Ref Rng & Units 05/21/2022   10:25 AM 02/14/2022    9:24 AM 01/08/2022    2:56 PM  CMP  Glucose 70 - 99 mg/dL 173  171  121   BUN 6 - 23 mg/dL 12  17  15   $ Creatinine 0.40 - 1.50 mg/dL 0.96  1.00  1.05   Sodium 135 - 145 mEq/L 139  137  138   Potassium 3.5 - 5.1 mEq/L 4.1  4.0  4.2   Chloride 96 - 112 mEq/L 102  103  103   CO2 19 - 32 mEq/L 27  25  25   $ Calcium 8.4 - 10.5 mg/dL 9.6  9.1  9.7    Total Protein 6.0 - 8.3 g/dL 7.8  7.5  7.7   Total Bilirubin 0.2 - 1.2 mg/dL 0.7  0.7  0.7   Alkaline Phos 39 - 117 U/L 72  64  75   AST 0 - 37 U/L 21  22  22   $ ALT 0 - 53 U/L 23  21  19      $ Review of Systems As per HPI     Objective:   Physical Exam  Vitals:   11/22/22 0953  BP: 128/76  Pulse: 100  Temp: 98.3 F (36.8 C)  TempSrc: Oral  SpO2: 97%  Weight: 247 lb 6.4 oz (112.2 kg)  Height: 6' 6"$  (1.981 m)    \Gen: Pleasant, well-nourished, in no distress,  normal affect  ENT: No lesions,  mouth clear,  oropharynx clear, no postnasal drip  Neck: No JVD, no stridor  Lungs: No use of accessory muscles, few bibasilar inspiratory crackles, right greater than left  Cardiovascular: RRR, 2/6 crescendo murmur with an intact S2  Musculoskeletal: No deformities,  no cyanosis or clubbing  Neuro: alert, awake, non focal  Skin: Warm, no lesions or rash     Assessment & Plan:   Dyspnea on exertion Fairly good exercise tolerance, he is trying to exercise more.  No obstruction on his pulmonary function testing but he gets a clinical response to Combivent, occasionally uses.  No clear indication to start scheduled BD therapy at this time.  Plan to continue the Combivent when he needs  Cryptogenic organizing pneumonia [facility]) Plan to repeat a surveillance CT scan of the chest in June 2024.  We will follow-up after that study.  Also recheck his CBC and LFT at that time.  Continue Imuran 150 daily, Bactrim prophylaxis.    Baltazar Apo, MD, PhD 11/22/2022, 10:13 AM [facility] [phone] or if no answer before 7:00PM call [phone] For any issues after 7:00PM please call eLink [phone]

## 2022-11-22 NOTE — Assessment & Plan Note (Signed)
Plan to repeat a surveillance CT scan of the chest in June 2024.  We will follow-up after that study.  Also recheck his CBC and LFT at that time.  Continue Imuran 150 daily, Bactrim prophylaxis.

## 2022-12-31 ENCOUNTER — Encounter: Payer: Self-pay | Admitting: Emergency Medicine

## 2022-12-31 MED ORDER — SULFAMETHOXAZOLE-TRIMETHOPRIM 800-160 MG PO TABS: ORAL_TABLET | ORAL | 1 refills | Status: DC

## 2023-03-21 ENCOUNTER — Ambulatory Visit (HOSPITAL_BASED_OUTPATIENT_CLINIC_OR_DEPARTMENT_OTHER)
Admission: RE | Admit: 2023-03-21 | Discharge: 2023-03-21 | Disposition: A | Discharge status: Home / Self Care | Payer: No Typology Code available for payment source | Source: Ambulatory Visit | Attending: Emergency Medicine | Admitting: Emergency Medicine

## 2023-03-21 DIAGNOSIS — J84116 Cryptogenic organizing pneumonia: Secondary | ICD-10-CM | POA: Insufficient documentation

## 2023-04-05 ENCOUNTER — Encounter: Payer: Self-pay | Admitting: Emergency Medicine

## 2023-04-07 MED ORDER — SULFAMETHOXAZOLE-TRIMETHOPRIM 800-160 MG PO TABS: ORAL_TABLET | ORAL | 2 refills | Status: DC

## 2023-04-22 ENCOUNTER — Telehealth: Payer: Self-pay | Admitting: Emergency Medicine

## 2023-04-22 MED ORDER — AZATHIOPRINE 50 MG PO TABS: 150.0000 mg | ORAL_TABLET | Freq: Every morning | ORAL | 5 refills | Status: DC

## 2023-04-22 NOTE — Telephone Encounter (Signed)
Refill for azaTHIOprine (IMURAN) 50 MG tablet has been sent to [facility] pharmacy. Nothing further needed. Pt is also aware

## 2023-04-22 NOTE — Telephone Encounter (Signed)
  PT is running out of azaTHIOprine  he cannot refill online. Can you please put in a  refill with the [facility] pharmacy?

## 2023-05-30 ENCOUNTER — Ambulatory Visit: Payer: No Typology Code available for payment source | Admitting: Emergency Medicine

## 2023-06-24 ENCOUNTER — Ambulatory Visit: Payer: No Typology Code available for payment source | Admitting: Emergency Medicine

## 2023-07-28 ENCOUNTER — Encounter: Payer: Self-pay | Admitting: Adult Health

## 2023-07-28 ENCOUNTER — Ambulatory Visit (INDEPENDENT_AMBULATORY_CARE_PROVIDER_SITE_OTHER): Payer: No Typology Code available for payment source

## 2023-07-28 ENCOUNTER — Other Ambulatory Visit (INDEPENDENT_AMBULATORY_CARE_PROVIDER_SITE_OTHER): Payer: No Typology Code available for payment source

## 2023-07-28 ENCOUNTER — Ambulatory Visit (INDEPENDENT_AMBULATORY_CARE_PROVIDER_SITE_OTHER): Payer: No Typology Code available for payment source | Admitting: Adult Health

## 2023-07-28 ENCOUNTER — Other Ambulatory Visit: Payer: No Typology Code available for payment source

## 2023-07-28 VITALS — BP 100/78 | HR 104 | Temp 98.2°F | Ht 78.0 in | Wt 238.0 lb

## 2023-07-28 DIAGNOSIS — J45909 Unspecified asthma, uncomplicated: Secondary | ICD-10-CM | POA: Insufficient documentation

## 2023-07-28 DIAGNOSIS — J84116 Cryptogenic organizing pneumonia: Secondary | ICD-10-CM

## 2023-07-28 DIAGNOSIS — J4521 Mild intermittent asthma with (acute) exacerbation: Secondary | ICD-10-CM

## 2023-07-28 DIAGNOSIS — R0609 Other forms of dyspnea: Secondary | ICD-10-CM

## 2023-07-28 LAB — CBC WITH DIFFERENTIAL/PLATELET
Basophils Absolute: 0 10*3/uL (ref 0.0–0.1)
Basophils Relative: 0.6 % (ref 0.0–3.0)
Eosinophils Absolute: 0.1 10*3/uL (ref 0.0–0.7)
Eosinophils Relative: 2.3 % (ref 0.0–5.0)
HCT: 44.5 % (ref 39.0–52.0)
Hemoglobin: 15.1 g/dL (ref 13.0–17.0)
Lymphocytes Relative: 26.8 % (ref 12.0–46.0)
Lymphs Abs: 0.8 10*3/uL (ref 0.7–4.0)
MCHC: 33.9 g/dL (ref 30.0–36.0)
MCV: 102.9 fL — ABNORMAL HIGH (ref 78.0–100.0)
Monocytes Absolute: 0.6 10*3/uL (ref 0.1–1.0)
Monocytes Relative: 18 % — ABNORMAL HIGH (ref 3.0–12.0)
Neutro Abs: 1.7 10*3/uL (ref 1.4–7.7)
Neutrophils Relative %: 52.3 % (ref 43.0–77.0)
Platelets: 249 10*3/uL (ref 150.0–400.0)
RBC: 4.33 Mil/uL (ref 4.22–5.81)
RDW: 13.5 % (ref 11.5–15.5)
WBC: 3.2 10*3/uL — ABNORMAL LOW (ref 4.0–10.5)

## 2023-07-28 LAB — COMPREHENSIVE METABOLIC PANEL
ALT: 17 U/L (ref 0–53)
AST: 16 U/L (ref 0–37)
Albumin: 4.3 g/dL (ref 3.5–5.2)
Alkaline Phosphatase: 67 U/L (ref 39–117)
BUN: 12 mg/dL (ref 6–23)
CO2: 23 meq/L (ref 19–32)
Calcium: 9 mg/dL (ref 8.4–10.5)
Chloride: 104 meq/L (ref 96–112)
Creatinine, Ser: 0.79 mg/dL (ref 0.40–1.50)
GFR: 99.77 mL/min (ref >60.00)
Glucose, Bld: 158 mg/dL — ABNORMAL HIGH (ref 70–99)
Potassium: 3.9 meq/L (ref 3.5–5.1)
Sodium: 136 meq/L (ref 135–145)
Total Bilirubin: 0.5 mg/dL (ref 0.2–1.2)
Total Protein: 7.3 g/dL (ref 6.0–8.3)

## 2023-07-28 MED ORDER — AZATHIOPRINE 50 MG PO TABS: 150.0000 mg | ORAL_TABLET | Freq: Every morning | ORAL | 5 refills | Status: DC

## 2023-07-28 MED ORDER — COMBIVENT RESPIMAT 20-100 MCG/ACT IN AERS: 1.0000 | INHALATION_SPRAY | Freq: Four times a day (QID) | RESPIRATORY_TRACT | 3 refills | Status: DC

## 2023-07-28 MED ORDER — SULFAMETHOXAZOLE-TRIMETHOPRIM 800-160 MG PO TABS: ORAL_TABLET | ORAL | 2 refills | Status: DC

## 2023-07-28 MED ORDER — PREDNISONE 20 MG PO TABS: 20.0000 mg | ORAL_TABLET | Freq: Every day | ORAL | 0 refills | Status: DC

## 2023-07-28 NOTE — Assessment & Plan Note (Signed)
Cough times last 3 weeks possibly a component of mild intermittent asthma/reactive airways.  Patient has Combivent that he has to use as needed.  Will give a short course of steroids.  Check chest x-ray today.  Combivent as needed.  Check PFTs on return  Plan  Patient Instructions  Chest xray today  Prednisone 20mg  daily for 5 days, take with food  Delsym 2 tsp Twice daily  for cough As needed   Combivent 1 puff every 6hrs as needed  Continue on Imuran and Bactrim  Labs today  Activity as tolerated.   Consider RSV vaccine later this month .   Follow up with Dr. Delton Coombes  in 4 months with PFT and As needed

## 2023-07-28 NOTE — Progress Notes (Signed)
@Patient  ID: Sean Simmons, male    DOB: July 29, 1966, 57 y.o.   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 Currently managed on Imuran with prophylactic Bactrim.  Care managed at [facility] - previously in the [facility].   TEST/EVENTS :  CT chest March 21, 2023 widespread patchy groundglass opacities predominantly in the lower lobes and right middle lobe associated with mild bronchiectasis.  Slightly worse in the right lower lobe otherwise unchanged.  Small solid right middle lobe pulmonary nodule 0.4 cm.  PFTs May 21, 2022 showed moderate restriction with FEV1 at 75%, ratio 88, FVC 66%, no significant bronchodilator response, DLCO 65%.  07/28/2023 Follow up ; [facility], Lupus with Sacroilitis  Patient presents for a 47-month follow-up.  Patient is followed for steroid responsive groundglass opacities consistent with cryptogenic organizing pneumonia.  Was previously on steroids from 2022 to mid 2023.  He remains on Imuran 150 mg daily along with prophylactic Bactrim 3 days a week.  Says overall breathing is doing about the same except that he has had an increased dry cough for the last several weeks.  Has some intermittent wheezing.  Has used his Combivent a few times.  But cough continues.  Has no discolored mucus fever hemoptysis chest pain or orthopnea.  Denies any significant postnasal drainage.  Serial CT March 21, 2023 showed widespread patchy groundglass opacities predominantly in the lower lungs and right middle lobe.  Right middle lobe with slightly worsened otherwise stable.  A small right middle lobe nodule measuring 0.4 cm.   Allergies  Allergen Reactions   Aspirin-Acetaminophen-Caffeine Other (See Comments)   Iodinated Contrast  Media Other (See Comments) and Swelling   Sildenafil Other (See Comments)    Immunization History  Administered Date(s) Administered   Influenza, Seasonal, Injecte, Preservative Fre 08/24/2010   Influenza,inj,Quad PF,6+ Mos 08/30/2016, 10/05/2020, 10/22/2022   Influenza-Unspecified 07/14/2012, 09/28/2013, 07/04/2014, 09/29/2015, 07/14/2021   Moderna Covid-19 Fall Seasonal Vaccine 43yrs & older 10/22/2022, 07/09/2023   Moderna Sars-Covid-2 Vaccination 09/18/2020, 04/05/2021   PFIZER(Purple Top)SARS-COV-2 Vaccination 12/03/2019, 12/24/2019   PNEUMOCOCCAL CONJUGATE-20 10/25/2021   Pneumococcal Conjugate-13 03/14/2016   Pneumococcal Polysaccharide-23 09/29/2015   Tdap 08/15/2007, 05/17/2016   Zoster Recombinant(Shingrix) 10/05/2020, 04/17/2021    No past medical history on file.  Tobacco History: Social History   Tobacco Use  Smoking Status Never  Smokeless Tobacco Never   Counseling given: Not Answered   Outpatient Medications Prior to Visit  Medication Sig Dispense Refill   empagliflozin (JARDIANCE) 25 MG TABS tablet Take 0.5 tablets by mouth daily.     metFORMIN (GLUCOPHAGE) 500 MG tablet Take by mouth.     rosuvastatin (CRESTOR) 10 MG tablet TAKE ONE-HALF TABLET BY MOUTH MONDAY, WEDNESDAY AND FRIDAY FOR CHOLESTEROL     Semaglutide (OZEMPIC, 0.25 OR 0.5 MG/DOSE, [place])      azaTHIOprine (IMURAN) 50 MG tablet Take 3 tablets (150 mg total) by mouth every morning. 90 tablet 5   Ipratropium-Albuterol (COMBIVENT RESPIMAT) 20-100 MCG/ACT AERS respimat Inhale 1 puff into the lungs every 6 (six) hours. 4 g 3   sulfamethoxazole-trimethoprim (BACTRIM DS) 800-160 MG tablet TAKE 1 TABLET BY MOUTH MONDAY, WEDNESDAY AND FRIDAY 90 tablet 2   glipiZIDE (GLUCOTROL) 10 MG tablet TAKE ONE TABLET BY MOUTH TWICE A DAY BEFORE MEALS FOR  DIABETES (TAKE THIS MEDICATION 30 MINUTES PRIOR TO A MEAL)     No facility-administered medications prior to visit.     Review of Systems:   Constitutional:   No   weight loss, night sweats,  Fevers, chills,  +fatigue, or  lassitude.  HEENT:   No headaches,  Difficulty swallowing,  Tooth/dental problems, or  Sore throat,                No sneezing, itching, ear ache, nasal congestion, post nasal drip,   CV:  No chest pain,  Orthopnea, PND, swelling in lower extremities, anasarca, dizziness, palpitations, syncope.   GI  No heartburn, indigestion, abdominal pain, nausea, vomiting, diarrhea, change in bowel habits, loss of appetite, bloody stools.   Resp: Skin: no rash or lesions.  GU: no dysuria, change in color of urine, no urgency or frequency.  No flank pain, no hematuria   MS:  No joint pain or swelling.  No decreased range of motion.  No back pain.    Physical Exam  BP 100/78 (BP Location: Left Arm, Patient Position: Sitting, Cuff Size: Large)   Pulse (!) 104   Temp 98.2 F (36.8 C) (Oral)   Ht 6\' 6"  (1.981 m)   Wt 238 lb (108 kg)   SpO2 98%   BMI 27.50 kg/m   GEN: A/Ox3; pleasant , NAD, well nourished    HEENT:  [place]/AT,NOSE-clear, THROAT-clear, no lesions, no postnasal drip or exudate noted.   NECK:  Supple w/ fair ROM; no JVD; normal carotid impulses w/o bruits; no thyromegaly or nodules palpated; no lymphadenopathy.    RESP  Clear  P & A; w/o, wheezes/ rales/ or rhonchi. no accessory muscle use, no dullness to percussion  CARD:  RRR, no m/r/g, no peripheral edema, pulses intact, no cyanosis or clubbing.  GI:   Soft & nt; nml bowel sounds; no organomegaly or masses detected.   Musco: Warm bil, no deformities or joint swelling noted.   Neuro: alert, no focal deficits noted.    Skin: Warm, no lesions or rashes    Lab Results:  CBC    Component Value Date/Time   WBC 3.2 (L) 07/28/2023 0931   RBC 4.33 07/28/2023 0931   HGB 15.1 07/28/2023 0931   HCT 44.5 07/28/2023 0931   PLT 249.0 07/28/2023 0931   MCV 102.9 (H) 07/28/2023 0931   MCHC 33.9 07/28/2023 0931   RDW 13.5 07/28/2023 0931   LYMPHSABS 0.8 07/28/2023 0931    MONOABS 0.6 07/28/2023 0931   EOSABS 0.1 07/28/2023 0931   BASOSABS 0.0 07/28/2023 0931    BMET    Component Value Date/Time   NA 136 07/28/2023 0931   K 3.9 07/28/2023 0931   CL 104 07/28/2023 0931   CO2 23 07/28/2023 0931   GLUCOSE 158 (H) 07/28/2023 0931   BUN 12 07/28/2023 0931   CREATININE 0.79 07/28/2023 0931   CALCIUM 9.0 07/28/2023 0931    BNP No results found for: "BNP"  ProBNP No results found for: "PROBNP"  Imaging: DG Chest 2 View  Result Date: 07/28/2023 CLINICAL DATA:  Cough and chest congestion. EXAM: CHEST - 2 VIEW COMPARISON:  None Available. FINDINGS: The heart size and mediastinal contours are within normal limits. Mild scarring noted in right lower lobe. The lungs are otherwise clear. The visualized skeletal structures are unremarkable. IMPRESSION: Mild right lower lobe scarring. No active cardiopulmonary disease. Electronically Signed   By: Danae Orleans M.D.   On: 07/28/2023 09:58    Administration  History     None          Latest Ref Rng & Units 05/21/2022    8:38 AM  PFT Results  FVC-Pre L 4.26   FVC-Predicted Pre % 64   FVC-Post L 4.34   FVC-Predicted Post % 66   Pre FEV1/FVC % % 85   Post FEV1/FCV % % 88   FEV1-Pre L 3.62   FEV1-Predicted Pre % 72   FEV1-Post L 3.81   DLCO uncorrected ml/min/mmHg 24.29   DLCO UNC% % 65   DLCO corrected ml/min/mmHg 24.29   DLCO COR %Predicted % 65   DLVA Predicted % 101   TLC L 6.10   TLC % Predicted % 69   RV % Predicted % 68     No results found for: "NITRICOXIDE"      Assessment & Plan:   Cryptogenic organizing pneumonia (HCC)  cryptogenic organizing pneumonia with steroid responsive groundglass opacities -completed steroid course from 20 22-2023.  Patient has been off of daily steroids for greater than 1 year.  Remains on Imuran 150 mg daily with Bactrim 3 days a week.  Check labs today.  Recent CT chest showed essentially stable patchy groundglass opacities except slightly increased in  the right middle lobe.  Will repeat PFTs on follow-up visit.  Plan Patient Instructions  Chest xray today  Prednisone 20mg  daily for 5 days, take with food  Delsym 2 tsp Twice daily  for cough As needed   Combivent 1 puff every 6hrs as needed  Continue on Imuran and Bactrim  Labs today  Activity as tolerated.   Consider RSV vaccine later this month .   Follow up with Dr. Delton Coombes  in 4 months with PFT and As needed       RAD (reactive airway disease) Cough times last 3 weeks possibly a component of mild intermittent asthma/reactive airways.  Patient has Combivent that he has to use as needed.  Will give a short course of steroids.  Check chest x-ray today.  Combivent as needed.  Check PFTs on return  Plan  Patient Instructions  Chest xray today  Prednisone 20mg  daily for 5 days, take with food  Delsym 2 tsp Twice daily  for cough As needed   Combivent 1 puff every 6hrs as needed  Continue on Imuran and Bactrim  Labs today  Activity as tolerated.   Consider RSV vaccine later this month .   Follow up with Dr. Delton Coombes  in 4 months with PFT and As needed         Rubye Oaks, NP 07/28/2023

## 2023-07-28 NOTE — Patient Instructions (Addendum)
Chest xray today  Prednisone 20mg  daily for 5 days, take with food  Delsym 2 tsp Twice daily  for cough As needed   Combivent 1 puff every 6hrs as needed  Continue on Imuran and Bactrim  Labs today  Activity as tolerated.   Consider RSV vaccine later this month .   Follow up with Dr. Delton Coombes  in 4 months with PFT and As needed

## 2023-07-28 NOTE — Assessment & Plan Note (Signed)
cryptogenic organizing pneumonia with steroid responsive groundglass opacities -completed steroid course from 20 22-2023.  Patient has been off of daily steroids for greater than 1 year.  Remains on Imuran 150 mg daily with Bactrim 3 days a week.  Check labs today.  Recent CT chest showed essentially stable patchy groundglass opacities except slightly increased in the right middle lobe.  Will repeat PFTs on follow-up visit.  Plan Patient Instructions  Chest xray today  Prednisone 20mg  daily for 5 days, take with food  Delsym 2 tsp Twice daily  for cough As needed   Combivent 1 puff every 6hrs as needed  Continue on Imuran and Bactrim  Labs today  Activity as tolerated.   Consider RSV vaccine later this month .   Follow up with Dr. Delton Coombes  in 4 months with PFT and As needed

## 2023-11-27 ENCOUNTER — Ambulatory Visit: Payer: No Typology Code available for payment source | Admitting: Emergency Medicine

## 2023-11-27 ENCOUNTER — Encounter: Payer: Self-pay | Admitting: Emergency Medicine

## 2023-11-27 VITALS — BP 114/74 | HR 99 | Ht 79.0 in | Wt 247.0 lb

## 2023-11-27 DIAGNOSIS — J45909 Unspecified asthma, uncomplicated: Secondary | ICD-10-CM | POA: Diagnosis not present

## 2023-11-27 DIAGNOSIS — J84116 Cryptogenic organizing pneumonia: Secondary | ICD-10-CM

## 2023-11-27 DIAGNOSIS — J4521 Mild intermittent asthma with (acute) exacerbation: Secondary | ICD-10-CM

## 2023-11-27 DIAGNOSIS — R0609 Other forms of dyspnea: Secondary | ICD-10-CM

## 2023-11-27 LAB — PULMONARY FUNCTION TEST
DL/VA % pred: 105 %
DL/VA: 4.38 ml/min/mmHg/L
DLCO cor % pred: 68 %
DLCO cor: 25.3 ml/min/mmHg
DLCO unc % pred: 68 %
DLCO unc: 25.3 ml/min/mmHg
FEF 25-75 Post: 5.38 L/s
FEF 25-75 Pre: 4.25 L/s
FEF2575-%Change-Post: 26 %
FEF2575-%Pred-Post: 132 %
FEF2575-%Pred-Pre: 104 %
FEV1-%Change-Post: 4 %
FEV1-%Pred-Post: 76 %
FEV1-%Pred-Pre: 72 %
FEV1-Post: 3.77 L
FEV1-Pre: 3.62 L
FEV1FVC-%Change-Post: 3 %
FEV1FVC-%Pred-Pre: 108 %
FEV6-%Change-Post: 0 %
FEV6-%Pred-Post: 70 %
FEV6-%Pred-Pre: 69 %
FEV6-Post: 4.4 L
FEV6-Pre: 4.37 L
FEV6FVC-%Change-Post: 0 %
FEV6FVC-%Pred-Post: 103 %
FEV6FVC-%Pred-Pre: 103 %
FVC-%Change-Post: 0 %
FVC-%Pred-Post: 67 %
FVC-%Pred-Pre: 67 %
FVC-Post: 4.41 L
FVC-Pre: 4.38 L
Post FEV1/FVC ratio: 86 %
Post FEV6/FVC ratio: 100 %
Pre FEV1/FVC ratio: 83 %
Pre FEV6/FVC Ratio: 100 %
RV % pred: 64 %
RV: 1.71 L
TLC % pred: 68 %
TLC: 6.01 L

## 2023-11-27 NOTE — Patient Instructions (Signed)
Full PFT performed today.

## 2023-11-27 NOTE — Assessment & Plan Note (Signed)
PFTs continue to be reassuring without any evidence of obstruction today.  Will defer scheduled BD therapy.  He has Combivent that he uses rarely on an as-needed basis.

## 2023-11-27 NOTE — Progress Notes (Signed)
Full PFT performed today.

## 2023-11-27 NOTE — Progress Notes (Signed)
Subjective:    Patient ID: Sean Simmons, male    DOB: Jan 12, 1966, 58 y.o.   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  Currently managed on Imuran with prophylactic Bactrim.  There had been some discussion about him possibly starting Humira for his sacroiliitis but he has not needed to do so.  He does follow with rheumatology.  He has formally been on corticosteroids but has been weaned off.  He has restrictive lung disease on pulmonary function testing without any evidence of obstruction.  He has benefited from Combivent in the past and has available to use as needed.  Today he reports.  Since have seen him he has started Ozempic.  He is on azathioprine 150 mg daily, Bactrim Monday Wednesday Friday.  He last had blood work at the [place] in 09/2022 > LFT normal. He is trying to be a bit more active - walking and exercising more. Uses combivent about 2x a month, does seem to help him.   Last CT chest was 03/25/2022 with basilar predominant peribronchial and subpleural groundglass, some traction bronchiectasis, postinflammatory scarring.  Stable from priors.  We are planning for repeat in June 2024   ROV 11/27/2023 --Sean Simmons is a 50 with G6PD deficiency, lupus on immunosuppression for sacroiliitis, history of cryptogenic organizing pneumonia that have been managed with Imuran and prophylactic Bactrim.  He also follows at the [place].  He has no obstruction on pulmonary function testing but has benefited from Combivent as needed, currently using approximately few times a month.  Last seen in our office October 2024.  He had stable patchy groundglass opacities on CT chest 03/2023, stable on chest x-ray 07/28/2023. He not been on prednisone steady since 2023.  His breathing has been stable - able to exert. He has rare cough.   Pulmonary function testing performed today and reviewed by me  showed evidence for restriction on spirometry without any clear obstruction.  Normal flow volume loop.  No bronchodilator response.  His lung volumes confirm restriction with a TLC of 68% predicted.  His diffusion capacity is decreased and corrected to the normal range when adjusted for alveolar volume      Latest Ref Rng & Units 07/28/2023    9:31 AM 05/21/2022   10:25 AM 02/14/2022    9:24 AM  CBC  WBC 4.0 - 10.5 K/uL 3.2  3.6  4.0   Hemoglobin 13.0 - 17.0 g/dL 40.9  81.1  91.4   Hematocrit 39.0 - 52.0 % 44.5  44.5  43.1   Platelets 150.0 - 400.0 K/uL 249.0  220.0  249.0        Latest Ref Rng & Units 07/28/2023    9:31 AM 05/21/2022   10:25 AM 02/14/2022    9:24 AM  CMP  Glucose 70 - 99 mg/dL 782  956  213   BUN 6 - 23 mg/dL 12  12  17    Creatinine 0.40 - 1.50 mg/dL 0.86  5.78  4.69   Sodium 135 - 145 mEq/L 136  139  137   Potassium 3.5 - 5.1 mEq/L 3.9  4.1  4.0   Chloride 96 - 112 mEq/L 104  102  103   CO2 19 - 32 mEq/L 23  27  25    Calcium 8.4 - 10.5 mg/dL 9.0  9.6  9.1   Total Protein 6.0 - 8.3 g/dL 7.3  7.8  7.5  Total Bilirubin 0.2 - 1.2 mg/dL 0.5  0.7  0.7   Alkaline Phos 39 - 117 U/L 67  72  64   AST 0 - 37 U/L 16  21  22    ALT 0 - 53 U/L 17  23  21       Review of Systems As per HPI     Objective:   Physical Exam  Vitals:   11/27/23 1017  BP: 114/74  Pulse: 99  SpO2: 96%  Weight: 247 lb (112 kg)  Height: 6\' 7"  (2.007 m)    \Gen: Pleasant, well-nourished, in no distress,  normal affect  ENT: No lesions,  mouth clear,  oropharynx clear, no postnasal drip  Neck: No JVD, no stridor  Lungs: No use of accessory muscles, crackles at both base and most prominent in the right midlung  Cardiovascular: RRR, 2/6 crescendo murmur with an intact S2  Musculoskeletal: No deformities, no cyanosis or clubbing  Neuro: alert, awake, non focal  Skin: Warm, no lesions or rash     Assessment & Plan:   Cryptogenic organizing pneumonia (HCC) Clinically stable and also  stable on PFT, most recent CT chest.  Plan to continue his Imuran and Bactrim as he has been taking them.  Hold off on steroids at this time.  Plan to repeat his CT chest in June  RAD (reactive airway disease) PFTs continue to be reassuring without any evidence of obstruction today.  Will defer scheduled BD therapy.  He has Combivent that he uses rarely on an as-needed basis.     Levy Pupa, MD, PhD 11/27/2023, 10:39 AM [facility] [phone] or if no answer before 7:00PM call [phone] For any issues after 7:00PM please call eLink [phone]

## 2023-11-27 NOTE — Patient Instructions (Signed)
We reviewed your CT scan of the chest from June 2023.  This is stable.  Good news We will plan to repeat your CT chest in June 2025 We reviewed your pulmonary function testing from today.  These are also stable compared with your priors.  Good news. Please continue Imuran and Bactrim as you have been taking them We will not order any prednisone at this time Keep your Combivent available to use 2 puffs up to every 6 hours if needed for shortness of breath. Follow Dr. Delton Coombes in June after your CT chest so we can review those results together

## 2023-11-27 NOTE — Assessment & Plan Note (Addendum)
Clinically stable and also stable on PFT, most recent CT chest.  Plan to continue his Imuran and Bactrim as he has been taking them.  Hold off on steroids at this time.  Plan to repeat his CT chest in June

## 2023-12-13 IMAGING — CT CT CHEST W/O CM
2 of 4 series · 15 of 36 positions shown, 18 images · non-contrast
Comparison: Multiple prior CT scans.

CLINICAL DATA: Chronic shortness of breath.



[Series 2: thorax · axial · 0.82mm/px · z∈[-372,-98]mm · 12 of 163 slices shown, 15 images]
[im 13/163  mediastinal]
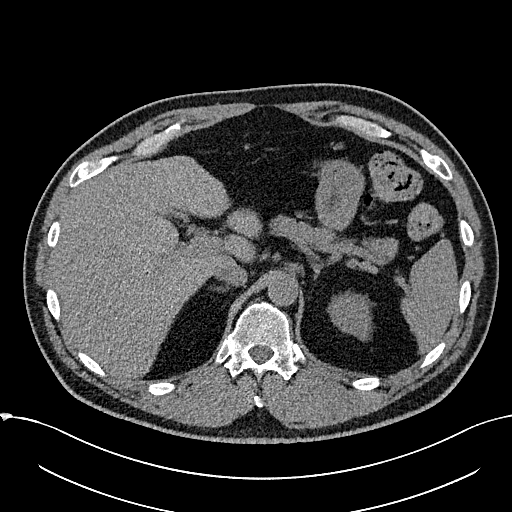
[im 13/163  lung]
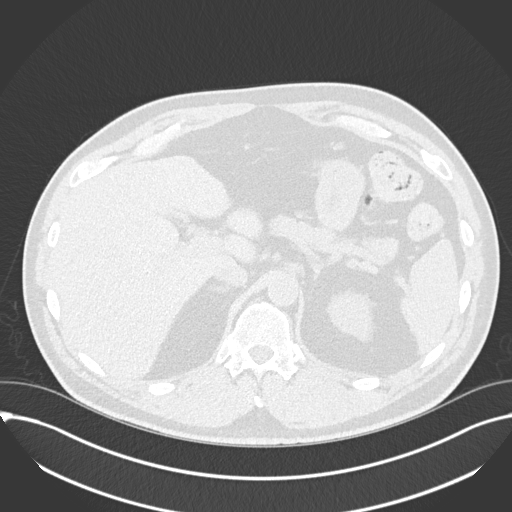
[im 25/163  lung]
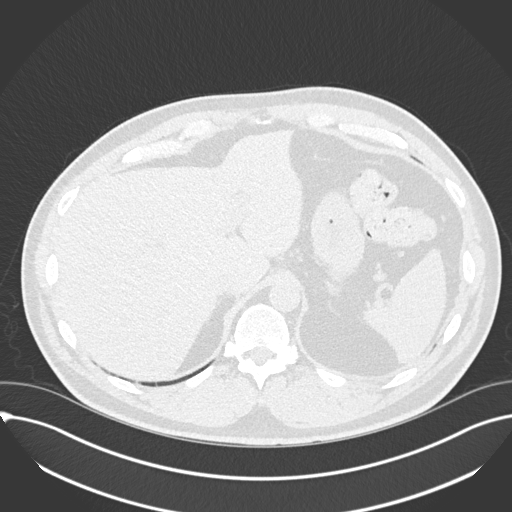
[im 38/163  lung]
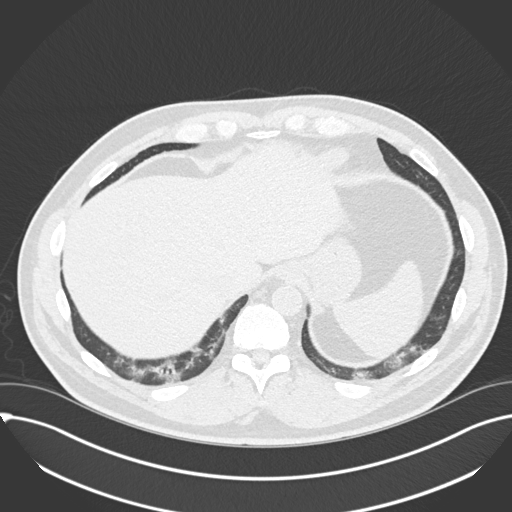
[im 50/163  lung]
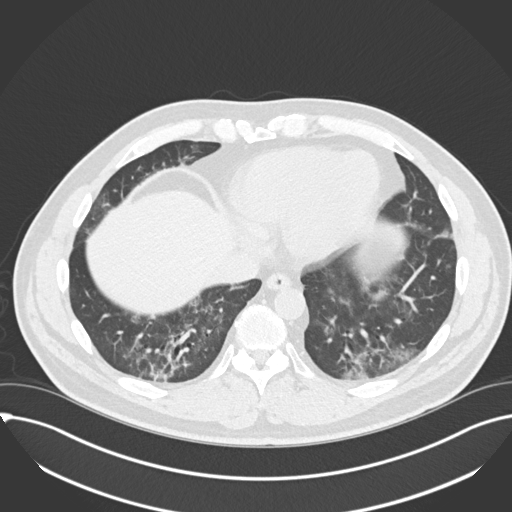
[im 63/163  mediastinal]
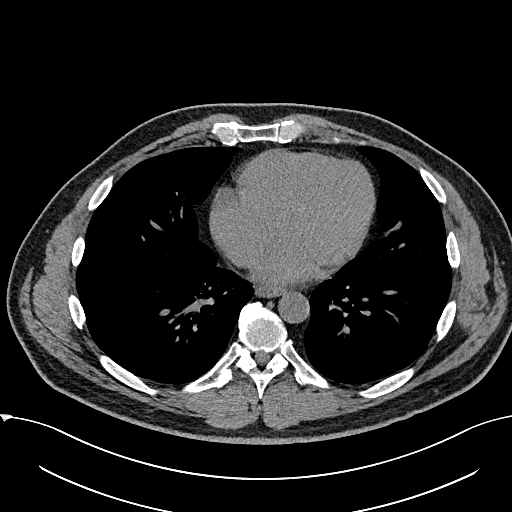
[im 63/163  lung]
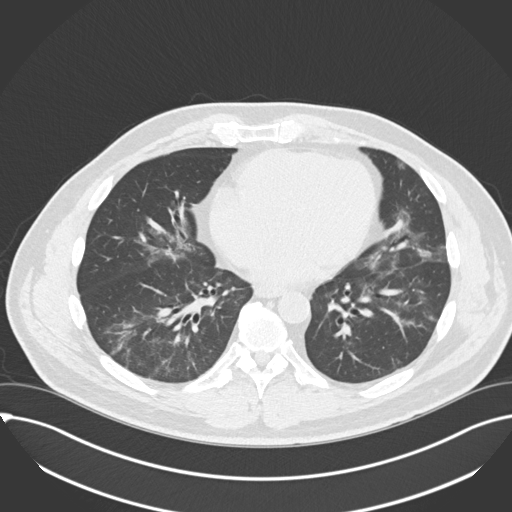
[im 75/163  lung]
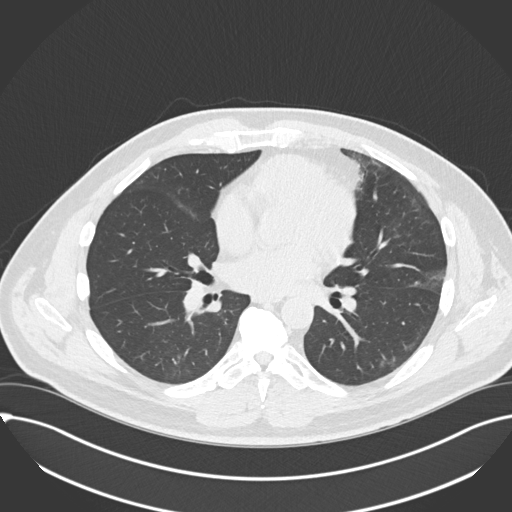
[im 88/163  lung]
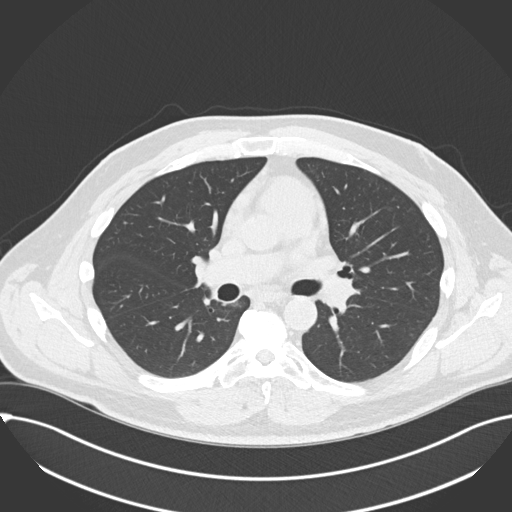
[im 100/163  lung]
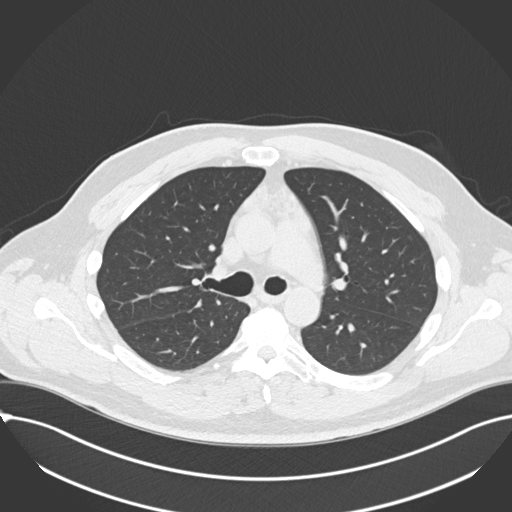
[im 113/163  mediastinal]
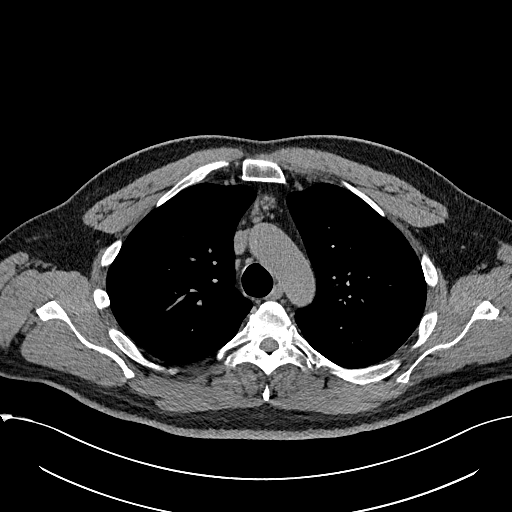
[im 113/163  lung]
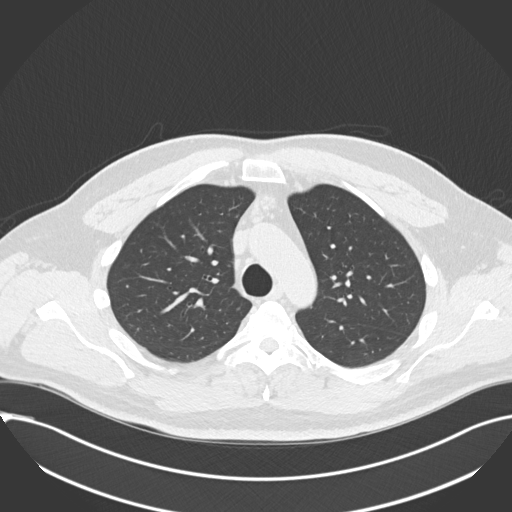
[im 125/163  lung]
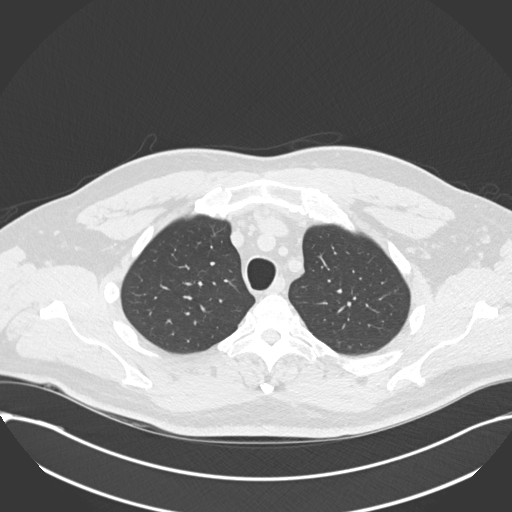
[im 138/163  lung]
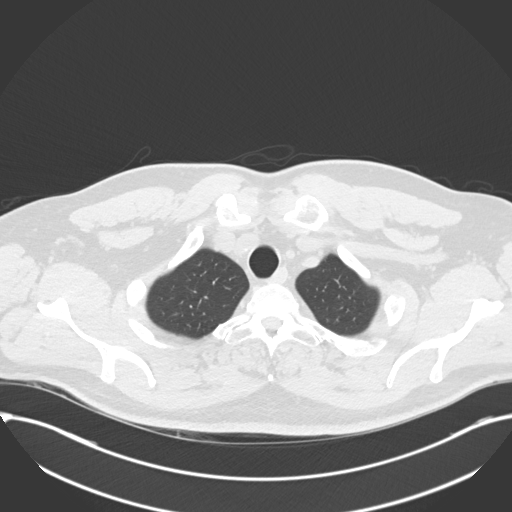
[im 150/163  lung]
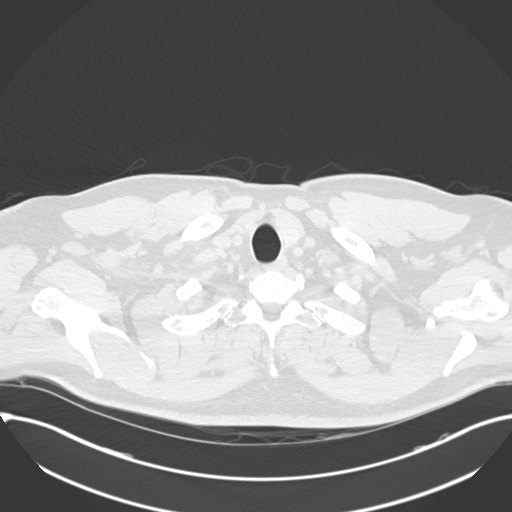

[Series 5: coronal · coronal · 0.63mm/px · 3 of 132 slices shown]
[im 27/132  lung]
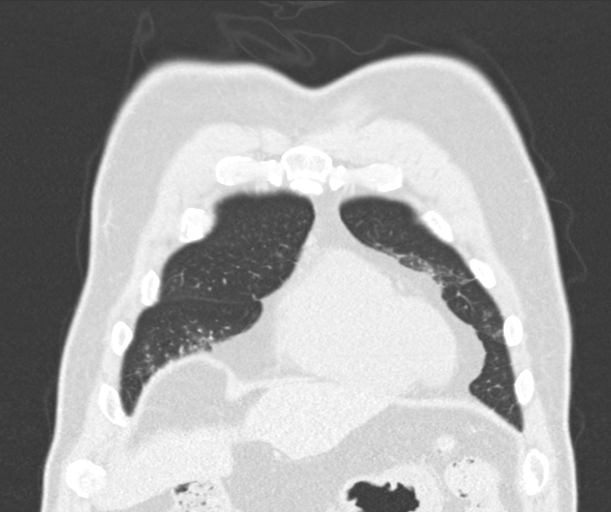
[im 53/132  lung]
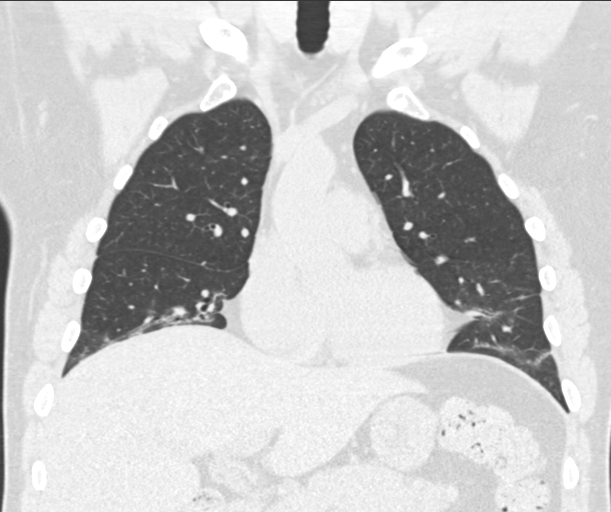
[im 79/132  lung]
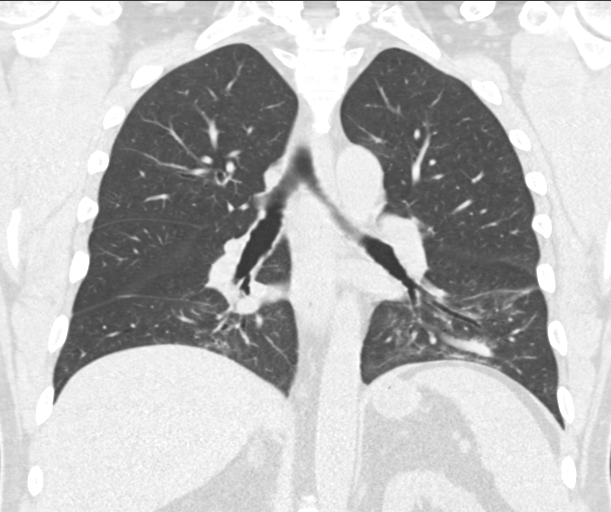

[15 of 36 positions shown; findings below may reference images not displayed]

Most recent CT scan in our
system is 07/18/2021. There are also multiple outside scans dating
back to 1840
FINDINGS: Cardiovascular: The heart is normal in size. No pericardial
effusion. The aorta is normal in caliber. Stable atherosclerotic
calcifications at the aortic arch. No definite coronary artery
calcifications.

Mediastinum/Nodes: Unchanged nodular soft tissue density in the
anterior mediastinum likely residual thymic tissue. No discrete mass
or cyst.

No mediastinal or hilar lymphadenopathy. The esophagus is grossly
normal.

Lungs/Pleura: Unchanged lower lung predominant patchy ill-defined
ground-glass attenuation, mild peribronchial thickening and
scattered areas of bronchiectasis. No worrisome pulmonary lesions or
new pulmonary infiltrates. No pleural effusions or pleural nodules.

Stable right middle lobe perifissural nodule measuring 5 mm
consistent with a benign lymph node. There are also a few tiny
perifissural nodules associated with the left major fissure. The
central tracheobronchial tree is unremarkable. No endobronchial
lesion.

Upper Abdomen: No significant upper abdominal findings.

Musculoskeletal: No chest wall mass, supraclavicular or axillary
adenopathy. The thyroid gland is unremarkable. The bony thorax is
intact.
IMPRESSION: 1. Unchanged lower lung predominant patchy ill-defined ground-glass
attenuation, mild peribronchial thickening and scattered areas of
bronchiectasis. Findings consistent with stable cryptogenic
organizing pneumonia.
2. No worrisome pulmonary lesions or new pulmonary infiltrates.
3. No mediastinal or hilar lymphadenopathy.
4. Stable nodular soft tissue density in the anterior mediastinum
likely prominent residual thymic tissue.

Aortic Atherosclerosis ([id number].V).

## 2023-12-17 ENCOUNTER — Ambulatory Visit (HOSPITAL_BASED_OUTPATIENT_CLINIC_OR_DEPARTMENT_OTHER)
Admission: RE | Admit: 2023-12-17 | Discharge: 2023-12-17 | Disposition: A | Discharge status: Home / Self Care | Payer: No Typology Code available for payment source | Source: Ambulatory Visit | Attending: Emergency Medicine | Admitting: Emergency Medicine

## 2023-12-17 DIAGNOSIS — J84116 Cryptogenic organizing pneumonia: Secondary | ICD-10-CM | POA: Diagnosis present

## 2024-03-05 ENCOUNTER — Ambulatory Visit: Payer: Self-pay | Admitting: Emergency Medicine

## 2024-03-05 DIAGNOSIS — J84116 Cryptogenic organizing pneumonia: Secondary | ICD-10-CM

## 2024-03-17 ENCOUNTER — Other Ambulatory Visit

## 2024-03-31 ENCOUNTER — Ambulatory Visit: Admitting: Emergency Medicine

## 2024-04-05 ENCOUNTER — Ambulatory Visit
Admission: RE | Admit: 2024-04-05 | Discharge: 2024-04-05 | Disposition: A | Discharge status: Home / Self Care | Source: Ambulatory Visit | Attending: Emergency Medicine | Admitting: Emergency Medicine

## 2024-04-05 ENCOUNTER — Other Ambulatory Visit

## 2024-04-05 DIAGNOSIS — J84116 Cryptogenic organizing pneumonia: Secondary | ICD-10-CM

## 2024-05-04 IMAGING — CT CT CHEST W/O CM
2 of 4 series · 15 of 36 positions shown, 18 images · non-contrast
Comparison: 11/02/2021, 11/03/2017

CLINICAL DATA: Cryptogenic organizing pneumonia



[Series 2: thorax · axial · 0.82mm/px · z∈[-345,-75]mm · 12 of 161 slices shown, 15 images]
[im 13/161  mediastinal]
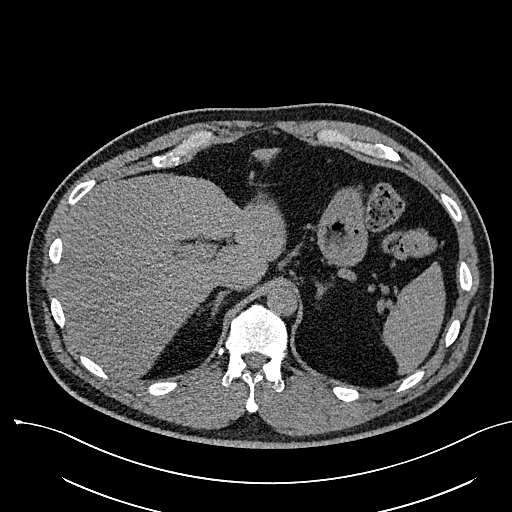
[im 13/161  lung]
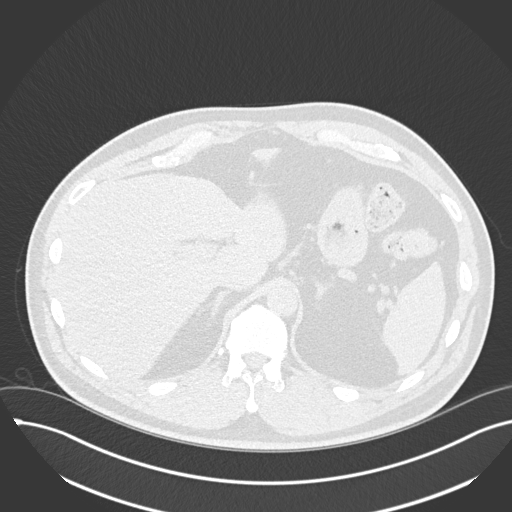
[im 25/161  lung]
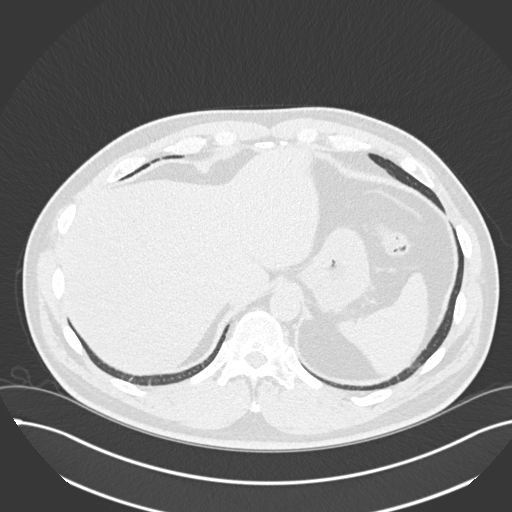
[im 37/161  lung]
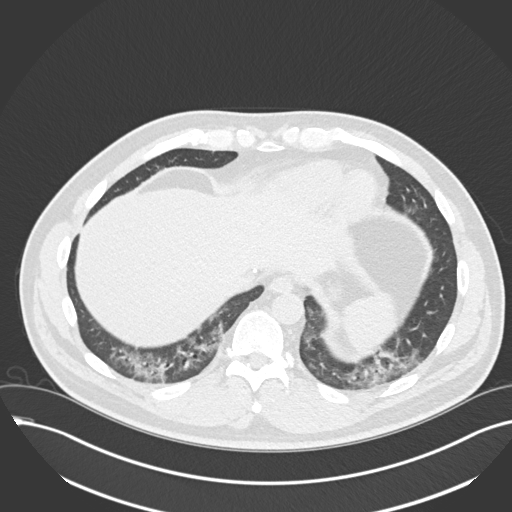
[im 50/161  lung]
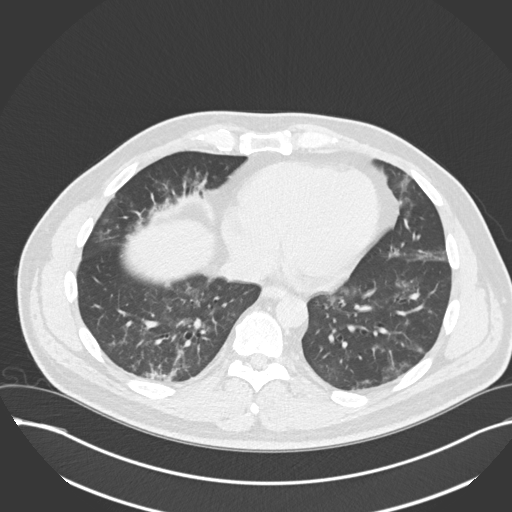
[im 62/161  mediastinal]
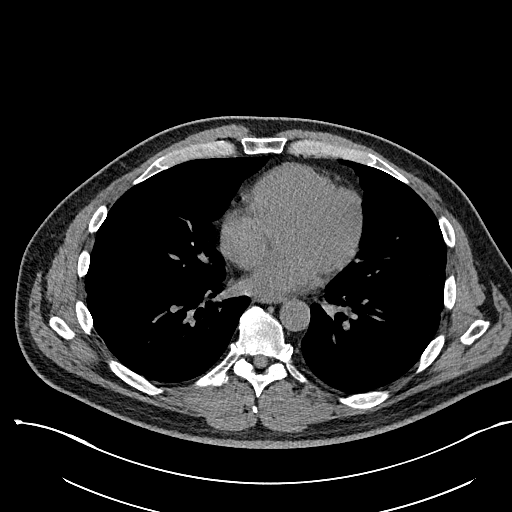
[im 62/161  lung]
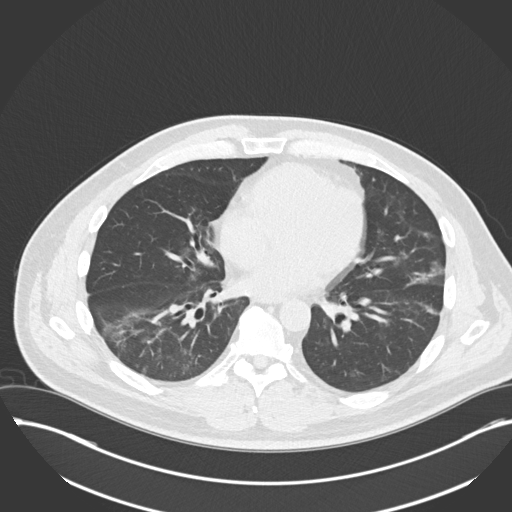
[im 74/161  lung]
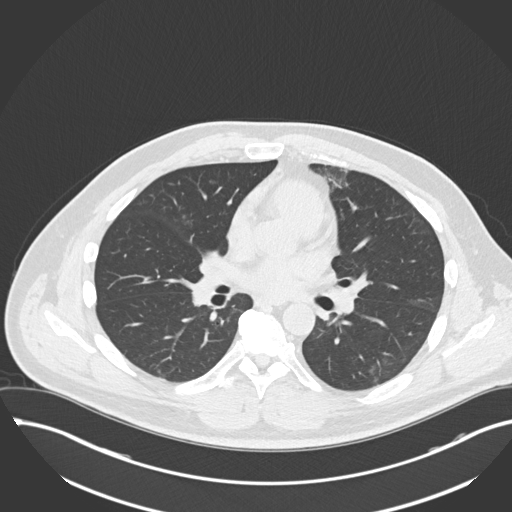
[im 87/161  lung]
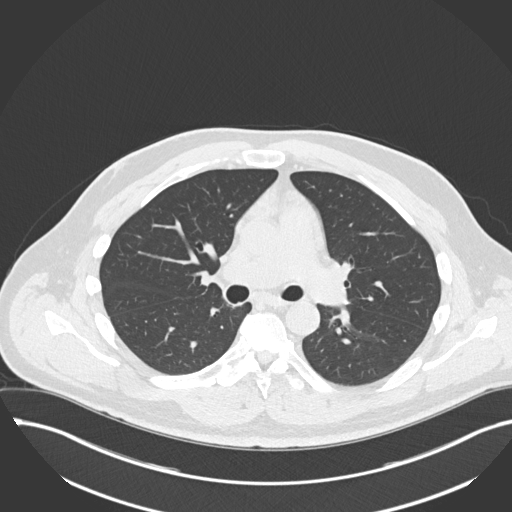
[im 99/161  lung]
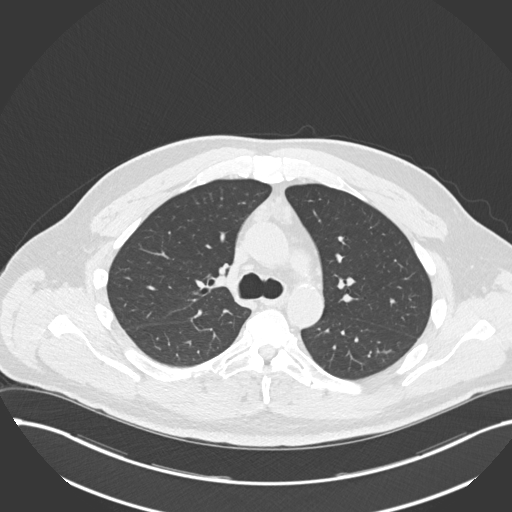
[im 111/161  mediastinal]
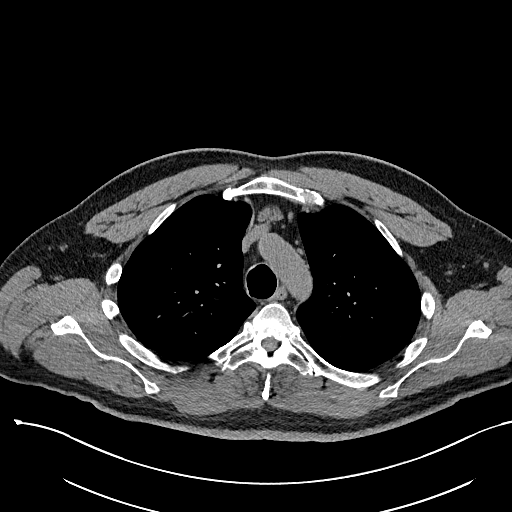
[im 111/161  lung]
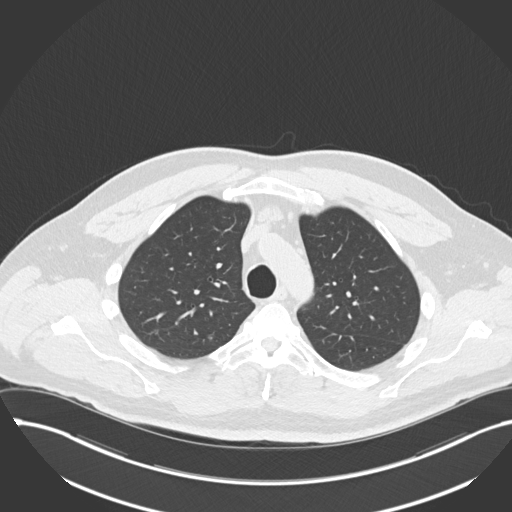
[im 124/161  lung]
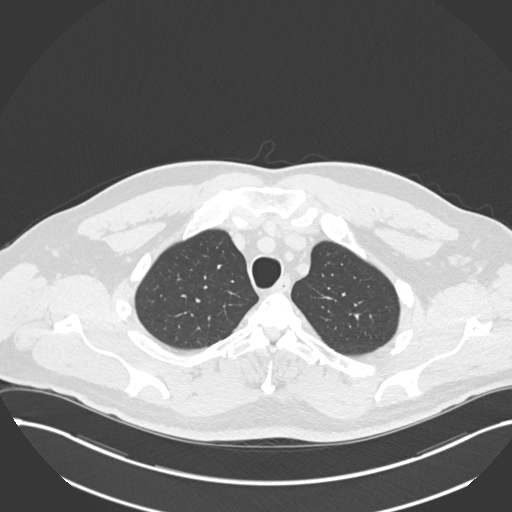
[im 136/161  lung]
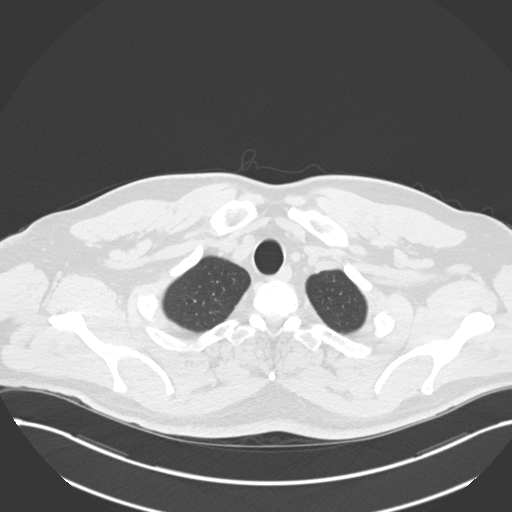
[im 148/161  lung]
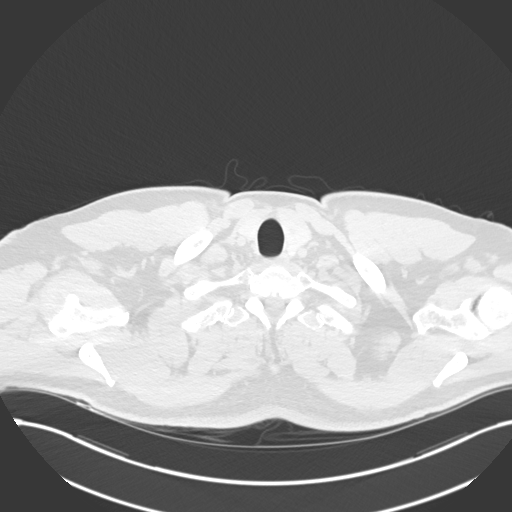

[Series 5: coronal · coronal · 0.63mm/px · 3 of 125 slices shown]
[im 25/125  lung]
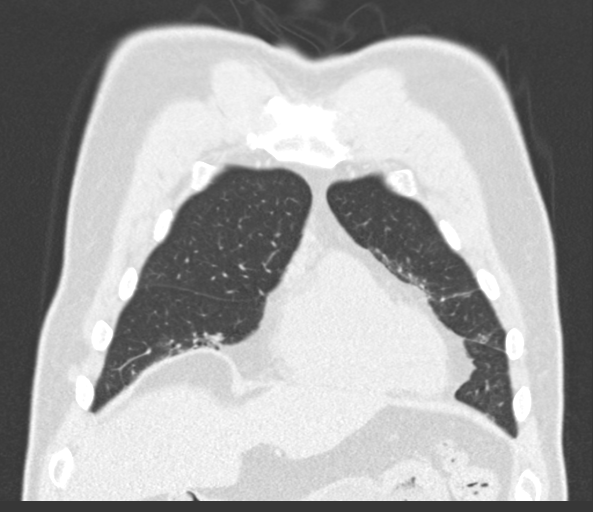
[im 50/125  lung]
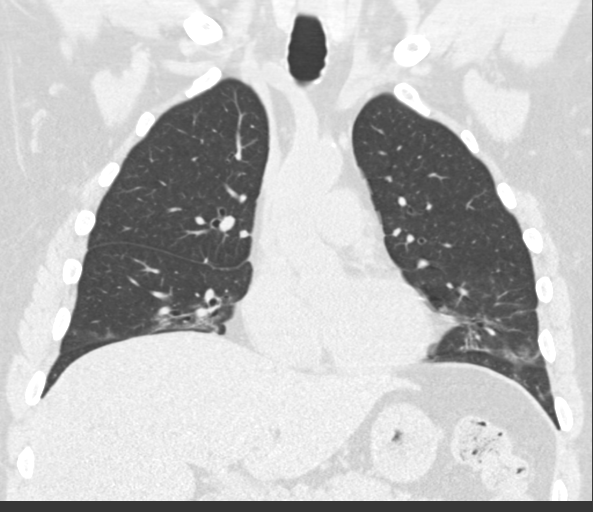
[im 75/125  lung]
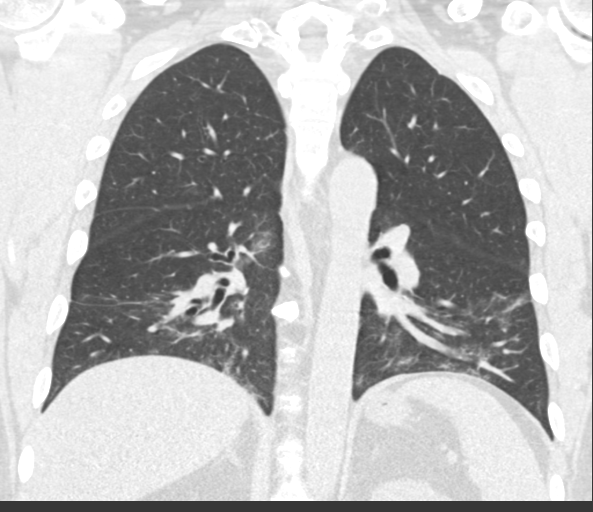

[15 of 36 positions shown; findings below may reference images not displayed]

FINDINGS: Cardiovascular: No significant coronary artery calcification.
Normal heart size. No pericardial effusion. Mild atherosclerotic
calcification within the thoracic aorta. No aortic aneurysm.

Mediastinum/Nodes: Stable nodular soft tissue within the anterior
mediastinum most in keeping with thymic hyperplasia. While stable
since immediate prior examination, this appears improved since
remote prior examination of 11/13/2017. No discrete mass lesion
identified. No pathologic thoracic adenopathy. Visualized thyroid is
unremarkable. Esophagus is unremarkable.

Lungs/Pleura: Pulmonary insufflation is symmetric and is stable when
compared to prior examination. There are stable areas of subpleural
and peribronchial ground-glass opacity with associated traction
bronchiolectasis possibly representing post inflammatory scarring as
these regions appear localized to areas of disease noted on remote
prior examination. No new focal pulmonary infiltrates identified. No
suspicious or indeterminate focal pulmonary nodules stable
subpleural pulmonary nodule within the right middle lobe, axial
image # 91/3, since remote prior examination, safely considered
benign. No pneumothorax or pleural effusion. No central obstructing
lesion.

Upper Abdomen: No acute abnormality.

Musculoskeletal: No acute bone abnormality. No lytic or blastic bone
lesion.
IMPRESSION: Stable basilar predominant peribronchial and subpleural ground-glass
pulmonary infiltrate with associated traction bronchiolectasis
possibly representing post inflammatory scarring versus chronic
inflammatory infiltrate given its stability since prior examination
and presence in areas of inflammatory change on remote prior exam.

Stable nodular soft tissue within the anterior mediastinum in
keeping with thymic hyperplasia.

Aortic Atherosclerosis ([id number]-SLA.A).

## 2024-05-06 ENCOUNTER — Encounter: Payer: Self-pay | Admitting: Emergency Medicine

## 2024-05-06 MED ORDER — AZATHIOPRINE 50 MG PO TABS: 150.0000 mg | ORAL_TABLET | Freq: Every morning | ORAL | 5 refills | Status: DC

## 2024-05-12 ENCOUNTER — Encounter: Payer: Self-pay | Admitting: Emergency Medicine

## 2024-05-12 ENCOUNTER — Ambulatory Visit: Admitting: Emergency Medicine

## 2024-05-12 VITALS — BP 110/74 | HR 91 | Temp 97.5°F | Ht 78.0 in | Wt 251.4 lb

## 2024-05-12 DIAGNOSIS — J4521 Mild intermittent asthma with (acute) exacerbation: Secondary | ICD-10-CM

## 2024-05-12 DIAGNOSIS — J84116 Cryptogenic organizing pneumonia: Secondary | ICD-10-CM

## 2024-05-12 MED ORDER — COMBIVENT RESPIMAT 20-100 MCG/ACT IN AERS: 1.0000 | INHALATION_SPRAY | Freq: Four times a day (QID) | RESPIRATORY_TRACT | 3 refills | Status: AC

## 2024-05-12 NOTE — Patient Instructions (Signed)
 Please continue Combivent  2 puffs up to every 6 hours if needed shortness of breath, chest tightness, wheezing. Will plan to repeat your CT scan of the chest in June 2026. Please follow Dr. Shelah in June after your CT so we can review those results.  Please call sooner if you have any new breathing or chest symptoms so we can see you at that time.

## 2024-05-12 NOTE — Assessment & Plan Note (Signed)
 Overall clinically stable.  He has had some intermittent chest tightness that responded to Combivent .  I asked him to mention this to his physicians at the [facility] in case they believe any other expanded workup needs to take place including possible cardiology referral.  It has resolved.  His CT scan of the chest is stable on his current Imuran  dosing.  He is on prophylactic Bactrim .  Please continue Combivent  2 puffs up to every 6 hours if needed shortness of breath, chest tightness, wheezing. Will plan to repeat your CT scan of the chest in June 2026. Please follow Dr. Shelah in June after your CT so we can review those results.  Please call sooner if you have any new breathing or chest symptoms so we can see you at that time.

## 2024-05-12 NOTE — Progress Notes (Signed)
 Subjective:    Patient ID: Sean Simmons, male    DOB: [birth year], 58 y.o.   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  He has a history of cryptogenic organizing pneumonia and has been managed on Imuran  with prophylactic Bactrim .  He is also followed at the [facility].  His pulmonary function has been stable, most recent February 2025.  He underwent a repeat CT 03/2024 as below.  He is feeling well, but has felt some chest tightness with exercise. Some benefit from combivent . He did not require pred or any extra therapy. No wheeze but he did have some cough. He is on ozempic, has been on for over a year.   CT scan of the chest 04/05/2024 reviewed by me, shows anterior mediastinal thymic tissue unchanged, multicentric parabronchial consolidations and crazy paving appearance particularly in the right middle lobe, lingula, lower lobes unchanged from his prior.  Also stable.  Fissural right middle lobe nodule, can be seen benign.      Latest Ref Rng & Units 07/28/2023    9:31 AM 05/21/2022   10:25 AM 02/14/2022    9:24 AM  CBC  WBC 4.0 - 10.5 K/uL 3.2  3.6  4.0   Hemoglobin 13.0 - 17.0 g/dL 84.8  84.7  85.3   Hematocrit 39.0 - 52.0 % 44.5  44.5  43.1   Platelets 150.0 - 400.0 K/uL 249.0  220.0  249.0        Latest Ref Rng & Units 07/28/2023    9:31 AM 05/21/2022   10:25 AM 02/14/2022    9:24 AM  CMP  Glucose 70 - 99 mg/dL 841  826  828   BUN 6 - 23 mg/dL 12  12  17    Creatinine 0.40 - 1.50 mg/dL 9.20  9.03  8.99   Sodium 135 - 145 mEq/L 136  139  137   Potassium 3.5 - 5.1 mEq/L 3.9  4.1  4.0   Chloride 96 - 112 mEq/L 104  102  103   CO2 19 - 32 mEq/L 23  27  25    Calcium 8.4 - 10.5 mg/dL 9.0  9.6  9.1   Total Protein 6.0 - 8.3 g/dL 7.3  7.8  7.5   Total Bilirubin 0.2 - 1.2 mg/dL 0.5  0.7  0.7   Alkaline Phos 39 - 117 U/L 67  72  64   AST 0 - 37 U/L 16  21  22    ALT 0 - 53 U/L 17  23   21       Review of Systems As per HPI     Objective:   Physical Exam  Vitals:   05/12/24 1041  BP: 110/74  Pulse: 91  Temp: (!) 97.5 F (36.4 C)  TempSrc: Temporal  SpO2: 96%  Weight: 251 lb 6.4 oz (114 kg)  Height: 6' 6 (1.981 m)    \Gen: Pleasant, well-nourished, in no distress,  normal affect  ENT: No lesions,  mouth clear,  oropharynx clear, no postnasal drip  Neck: No JVD, no stridor  Lungs: No use of accessory muscles, crackles at both base and most prominent in the right midlung  Cardiovascular: RRR, 2/6 crescendo murmur with an intact S2  Musculoskeletal: No deformities, no cyanosis or clubbing  Neuro: alert, awake, non focal  Skin: Warm, no lesions or rash     Assessment & Plan:   Cryptogenic organizing  pneumonia (HCC) Overall clinically stable.  He has had some intermittent chest tightness that responded to Combivent .  I asked him to mention this to his physicians at the [facility] in case they believe any other expanded workup needs to take place including possible cardiology referral.  It has resolved.  His CT scan of the chest is stable on his current Imuran  dosing.  He is on prophylactic Bactrim .  Please continue Combivent  2 puffs up to every 6 hours if needed shortness of breath, chest tightness, wheezing. Will plan to repeat your CT scan of the chest in June 2026. Please follow Dr. Shelah in June after your CT so we can review those results.  Please call sooner if you have any new breathing or chest symptoms so we can see you at that time.      Lamar Shelah, MD, PhD 05/12/2024, 11:24 AM [facility] [phone] or if no answer before 7:00PM call [phone] For any issues after 7:00PM please call eLink [phone]

## 2024-05-25 ENCOUNTER — Encounter: Payer: Self-pay | Admitting: Emergency Medicine

## 2024-05-25 MED ORDER — SULFAMETHOXAZOLE-TRIMETHOPRIM 800-160 MG PO TABS: ORAL_TABLET | ORAL | 2 refills | Status: DC

## 2024-09-15 ENCOUNTER — Encounter: Payer: Self-pay | Admitting: Emergency Medicine

## 2024-09-15 ENCOUNTER — Ambulatory Visit: Payer: Self-pay | Admitting: Emergency Medicine

## 2024-09-15 VITALS — BP 102/72 | HR 103 | Temp 97.9°F | Ht 78.0 in | Wt 250.4 lb

## 2024-09-15 DIAGNOSIS — J4521 Mild intermittent asthma with (acute) exacerbation: Secondary | ICD-10-CM

## 2024-09-15 DIAGNOSIS — J84116 Cryptogenic organizing pneumonia: Secondary | ICD-10-CM

## 2024-09-15 LAB — CBC WITH DIFFERENTIAL/PLATELET
Basophils Absolute: 0 K/uL (ref 0.0–0.1)
Basophils Relative: 0.5 % (ref 0.0–3.0)
Eosinophils Absolute: 0.1 K/uL (ref 0.0–0.7)
Eosinophils Relative: 2.6 % (ref 0.0–5.0)
HCT: 43.8 % (ref 39.0–52.0)
Hemoglobin: 15 g/dL (ref 13.0–17.0)
Lymphocytes Relative: 14.9 % (ref 12.0–46.0)
Lymphs Abs: 0.6 K/uL — ABNORMAL LOW (ref 0.7–4.0)
MCHC: 34.2 g/dL (ref 30.0–36.0)
MCV: 99.6 fl (ref 78.0–100.0)
Monocytes Absolute: 0.7 K/uL (ref 0.1–1.0)
Monocytes Relative: 16.2 % — ABNORMAL HIGH (ref 3.0–12.0)
Neutro Abs: 2.7 K/uL (ref 1.4–7.7)
Neutrophils Relative %: 65.8 % (ref 43.0–77.0)
Platelets: 276 K/uL (ref 150.0–400.0)
RBC: 4.4 Mil/uL (ref 4.22–5.81)
RDW: 13.1 % (ref 11.5–15.5)
WBC: 4.2 K/uL (ref 4.0–10.5)

## 2024-09-15 LAB — COMPREHENSIVE METABOLIC PANEL WITH GFR
ALT: 27 U/L (ref 0–53)
AST: 26 U/L (ref 0–37)
Albumin: 4.6 g/dL (ref 3.5–5.2)
Alkaline Phosphatase: 70 U/L (ref 39–117)
BUN: 16 mg/dL (ref 6–23)
CO2: 26 meq/L (ref 19–32)
Calcium: 9.8 mg/dL (ref 8.4–10.5)
Chloride: 101 meq/L (ref 96–112)
Creatinine, Ser: 0.8 mg/dL (ref 0.40–1.50)
GFR: 98.6 mL/min (ref >60.00)
Glucose, Bld: 110 mg/dL — ABNORMAL HIGH (ref 70–99)
Potassium: 4.4 meq/L (ref 3.5–5.1)
Sodium: 136 meq/L (ref 135–145)
Total Bilirubin: 0.5 mg/dL (ref 0.2–1.2)
Total Protein: 7.9 g/dL (ref 6.0–8.3)

## 2024-09-15 MED ORDER — SULFAMETHOXAZOLE-TRIMETHOPRIM 800-160 MG PO TABS: ORAL_TABLET | ORAL | 2 refills | Status: AC

## 2024-09-15 MED ORDER — AZATHIOPRINE 50 MG PO TABS: 150.0000 mg | ORAL_TABLET | Freq: Every morning | ORAL | 5 refills | Status: AC

## 2024-09-15 NOTE — Progress Notes (Signed)
 Subjective:    Patient ID: Sean Simmons, male    DOB: April 02, 1966, 58 y.o.   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  He has a history of cryptogenic organizing pneumonia and has been managed on Imuran  with prophylactic Bactrim .  He is also followed at the [facility].  His pulmonary function has been stable, most recent February 2025.  He underwent a repeat CT 03/2024 as below.  He is feeling well, but has felt some chest tightness with exercise. Some benefit from combivent . He did not require pred or any extra therapy. No wheeze but he did have some cough. He is on ozempic, has been on for over a year.   CT scan of the chest 04/05/2024 reviewed by me, shows anterior mediastinal thymic tissue unchanged, multicentric parabronchial consolidations and crazy paving appearance particularly in the right middle lobe, lingula, lower lobes unchanged from his prior.  Also stable.  Fissural right middle lobe nodule, can be seen benign.  ROV 09/15/2024 --Sean Simmons is 58 with a history of G6PD deficiency, SLE on immunosuppression for associated sacroiliitis.  He has a history of cryptogenic organizing pneumonia managed on Imuran  and prophylactic Bactrim , also followed at the [facility].  Planning for repeat chest imaging in June 2026.  Currently managed on Combivent  as needed, needs refills of his azathioprine  and Bactrim .  Overall doing okay, had a URI a couple weeks ago the last about 7 days.  He does still have some occasional cough, hears some rattling in his chest.  Can get some shortness of breath with significant exertion, uses his Combivent  at these times.  He has not yet had the flu shot, is planning to get this at the [facility]       Latest Ref Rng & Units 07/28/2023    9:31 AM 05/21/2022   10:25 AM 02/14/2022    9:24 AM  CBC  WBC 4.0 - 10.5 K/uL 3.2  3.6  4.0   Hemoglobin 13.0 - 17.0 g/dL 84.8  84.7  85.3    Hematocrit 39.0 - 52.0 % 44.5  44.5  43.1   Platelets 150.0 - 400.0 K/uL 249.0  220.0  249.0        Latest Ref Rng & Units 07/28/2023    9:31 AM 05/21/2022   10:25 AM 02/14/2022    9:24 AM  CMP  Glucose 70 - 99 mg/dL 841  826  828   BUN 6 - 23 mg/dL 12  12  17    Creatinine 0.40 - 1.50 mg/dL 9.20  9.03  8.99   Sodium 135 - 145 mEq/L 136  139  137   Potassium 3.5 - 5.1 mEq/L 3.9  4.1  4.0   Chloride 96 - 112 mEq/L 104  102  103   CO2 19 - 32 mEq/L 23  27  25    Calcium 8.4 - 10.5 mg/dL 9.0  9.6  9.1   Total Protein 6.0 - 8.3 g/dL 7.3  7.8  7.5   Total Bilirubin 0.2 - 1.2 mg/dL 0.5  0.7  0.7   Alkaline Phos 39 - 117 U/L 67  72  64   AST 0 - 37 U/L 16  21  22    ALT 0 - 53 U/L 17  23  21       Review of Systems As per HPI     Objective:   Physical Exam  Vitals:  09/15/24 1255  BP: 102/72  Pulse: (!) 103  Temp: 97.9 F (36.6 C)  SpO2: 100%  Weight: 250 lb 6.4 oz (113.6 kg)  Height: 6' 6 (1.981 m)    \Gen: Pleasant, well-nourished, in no distress,  normal affect  ENT: No lesions,  mouth clear,  oropharynx clear, no postnasal drip  Neck: No JVD, no stridor  Lungs: No use of accessory muscles, crackles at both base and most prominent in the right midlung  Cardiovascular: RRR, 2/6 crescendo murmur with an intact S2  Musculoskeletal: No deformities, no cyanosis or clubbing  Neuro: alert, awake, non focal  Skin: Warm, no lesions or rash     Assessment & Plan:   Cryptogenic organizing pneumonia (HCC) Overall clinically stable.  His imaging has been stable as well.  Next scan is planned for June 2026.  He has been managed on maintenance azathioprine  with prophylactic Bactrim .  He follows at the [facility] but unclear to me that they are getting his labs consistently.  I will check a CBC and CMP today to ensure stability on his medical regimen.  RAD (reactive airway disease) Overall doing well.  Keeps Combivent  available to use if needed.  He got over a recent URI and is  approaching baseline.  No wheezing on exam today.  I do not think we need to treat him with corticosteroids for a flare at this time       Lamar Chris, MD, PhD 09/15/2024, 1:12 PM [facility] [phone] or if no answer before 7:00PM call [phone] For any issues after 7:00PM please call eLink [phone]

## 2024-09-15 NOTE — Patient Instructions (Addendum)
 We will continue your azathioprine  and Bactrim  as ordered.  Refill sent today. Lab work today We will repeat your CT scan of the chest in June 2026 Follow with Dr. Shelah in June after your CT so we can review those results together

## 2024-09-15 NOTE — Assessment & Plan Note (Signed)
 Overall doing well.  Keeps Combivent  available to use if needed.  He got over a recent URI and is approaching baseline.  No wheezing on exam today.  I do not think we need to treat him with corticosteroids for a flare at this time

## 2024-09-15 NOTE — Assessment & Plan Note (Signed)
 Overall clinically stable.  His imaging has been stable as well.  Next scan is planned for June 2026.  He has been managed on maintenance azathioprine  with prophylactic Bactrim .  He follows at the [facility] but unclear to me that they are getting his labs consistently.  I will check a CBC and CMP today to ensure stability on his medical regimen.

## 2024-09-16 ENCOUNTER — Ambulatory Visit: Payer: Self-pay | Admitting: Emergency Medicine

## 2024-09-16 NOTE — Progress Notes (Signed)
 Left message on Pt vm per DPR, Nothing farther needed.
